# Patient Record
Sex: Female | Born: 1980 | Race: Black or African American | Hispanic: No | Marital: Single | Smoking: Never smoker
Health system: Southern US, Community
[De-identification: ages and names within clinical notes are randomized; demographics above are authoritative.]

## PROBLEM LIST (undated history)

## (undated) DIAGNOSIS — K219 Gastro-esophageal reflux disease without esophagitis: Secondary | ICD-10-CM

---

## 1999-12-11 ENCOUNTER — Emergency Department (HOSPITAL_COMMUNITY): Admission: EM | Admit: 1999-12-11 | Discharge: 1999-12-11 | Payer: Self-pay | Admitting: Emergency Medicine

## 1999-12-11 ENCOUNTER — Encounter: Payer: Self-pay | Admitting: Emergency Medicine

## 2008-10-22 ENCOUNTER — Emergency Department (HOSPITAL_COMMUNITY): Admission: EM | Admit: 2008-10-22 | Discharge: 2008-10-22 | Payer: Self-pay | Admitting: Emergency Medicine

## 2009-04-24 ENCOUNTER — Inpatient Hospital Stay (HOSPITAL_COMMUNITY): Admission: AD | Admit: 2009-04-24 | Discharge: 2009-04-24 | Payer: Self-pay | Admitting: Obstetrics and Gynecology

## 2009-06-05 ENCOUNTER — Inpatient Hospital Stay (HOSPITAL_COMMUNITY): Admission: RE | Admit: 2009-06-05 | Discharge: 2009-06-07 | Payer: Self-pay | Admitting: Obstetrics and Gynecology

## 2010-05-31 ENCOUNTER — Emergency Department (HOSPITAL_COMMUNITY): Payer: Medicaid Other

## 2010-05-31 ENCOUNTER — Emergency Department (HOSPITAL_COMMUNITY)
Admission: EM | Admit: 2010-05-31 | Discharge: 2010-05-31 | Disposition: A | Discharge status: Home / Self Care | Payer: Medicaid Other | Attending: Emergency Medicine | Admitting: Emergency Medicine

## 2010-05-31 DIAGNOSIS — R1013 Epigastric pain: Secondary | ICD-10-CM | POA: Insufficient documentation

## 2010-05-31 DIAGNOSIS — K219 Gastro-esophageal reflux disease without esophagitis: Secondary | ICD-10-CM | POA: Insufficient documentation

## 2010-05-31 DIAGNOSIS — J45909 Unspecified asthma, uncomplicated: Secondary | ICD-10-CM | POA: Insufficient documentation

## 2010-05-31 DIAGNOSIS — R1011 Right upper quadrant pain: Secondary | ICD-10-CM | POA: Insufficient documentation

## 2010-05-31 LAB — DIFFERENTIAL
Basophils Absolute: 0.1 10*3/uL (ref 0.0–0.1)
Basophils Relative: 1 % (ref 0–1)
Eosinophils Absolute: 0.2 10*3/uL (ref 0.0–0.7)
Eosinophils Relative: 2 % (ref 0–5)
Lymphocytes Relative: 29 % (ref 12–46)
Lymphs Abs: 3.1 10*3/uL (ref 0.7–4.0)
Monocytes Absolute: 0.6 10*3/uL (ref 0.1–1.0)
Monocytes Relative: 6 % (ref 3–12)
Neutro Abs: 6.9 10*3/uL (ref 1.7–7.7)
Neutrophils Relative %: 63 % (ref 43–77)

## 2010-05-31 LAB — URINALYSIS, ROUTINE W REFLEX MICROSCOPIC
Bilirubin Urine: NEGATIVE
Glucose, UA: NEGATIVE mg/dL
Hgb urine dipstick: NEGATIVE
Ketones, ur: NEGATIVE mg/dL
Nitrite: NEGATIVE
Protein, ur: NEGATIVE mg/dL
Specific Gravity, Urine: 1.006 (ref 1.005–1.030)
Urobilinogen, UA: 0.2 mg/dL (ref 0.0–1.0)
pH: 6 (ref 5.0–8.0)

## 2010-05-31 LAB — COMPREHENSIVE METABOLIC PANEL
AST: 19 U/L (ref 0–37)
Albumin: 3.6 g/dL (ref 3.5–5.2)
Calcium: 8.9 mg/dL (ref 8.4–10.5)
Chloride: 108 mEq/L (ref 96–112)
Creatinine, Ser: 0.94 mg/dL (ref 0.4–1.2)
GFR calc Af Amer: >60 mL/min (ref >60)
Total Protein: 6.6 g/dL (ref 6.0–8.3)

## 2010-05-31 LAB — CBC
MCH: 25.7 pg — ABNORMAL LOW (ref 26.0–34.0)
MCHC: 32.5 g/dL (ref 30.0–36.0)
Platelets: 298 10*3/uL (ref 150–400)
RBC: 4.17 MIL/uL (ref 3.87–5.11)

## 2010-05-31 LAB — POCT PREGNANCY, URINE: Preg Test, Ur: NEGATIVE

## 2010-06-16 LAB — CBC
HCT: 30.9 % — ABNORMAL LOW (ref 36.0–46.0)
Hemoglobin: 8.6 g/dL — ABNORMAL LOW (ref 12.0–15.0)
Hemoglobin: 9.9 g/dL — ABNORMAL LOW (ref 12.0–15.0)
MCHC: 32 g/dL (ref 30.0–36.0)
MCHC: 32.1 g/dL (ref 30.0–36.0)
MCV: 79.8 fL (ref 78.0–100.0)
Platelets: 196 10*3/uL (ref 150–400)
RBC: 3.31 MIL/uL — ABNORMAL LOW (ref 3.87–5.11)
RBC: 3.87 MIL/uL (ref 3.87–5.11)
WBC: 16.9 10*3/uL — ABNORMAL HIGH (ref 4.0–10.5)

## 2010-06-16 LAB — RPR: RPR Ser Ql: NONREACTIVE

## 2010-06-28 LAB — URINALYSIS, ROUTINE W REFLEX MICROSCOPIC
Glucose, UA: NEGATIVE mg/dL
Protein, ur: NEGATIVE mg/dL
Specific Gravity, Urine: 1.005 (ref 1.005–1.030)
pH: 6.5 (ref 5.0–8.0)

## 2010-06-28 LAB — HCG, QUANTITATIVE, PREGNANCY: hCG, Beta Chain, Quant, S: 36539 m[IU]/mL — ABNORMAL HIGH (ref <5)

## 2010-06-28 LAB — POCT PREGNANCY, URINE: Preg Test, Ur: POSITIVE

## 2012-07-22 ENCOUNTER — Other Ambulatory Visit: Payer: Self-pay | Admitting: Medical

## 2012-07-22 ENCOUNTER — Encounter: Payer: Self-pay | Admitting: Medical

## 2012-07-22 ENCOUNTER — Other Ambulatory Visit (INDEPENDENT_AMBULATORY_CARE_PROVIDER_SITE_OTHER): Payer: Medicaid Other | Admitting: General Practice

## 2012-07-22 DIAGNOSIS — Z3200 Encounter for pregnancy test, result unknown: Secondary | ICD-10-CM

## 2012-07-22 DIAGNOSIS — Z3201 Encounter for pregnancy test, result positive: Secondary | ICD-10-CM

## 2012-07-22 LAB — POCT PREGNANCY, URINE: Preg Test, Ur: POSITIVE — AB

## 2012-07-22 NOTE — Progress Notes (Signed)
Patient came in for a pregnancy test only. Informed patient of positive result. Patient stated she would start care with [facility] but was waiting for [facility] approval.

## 2012-08-03 ENCOUNTER — Encounter: Payer: Self-pay | Admitting: Advanced Practice Midwife

## 2012-08-03 ENCOUNTER — Ambulatory Visit (INDEPENDENT_AMBULATORY_CARE_PROVIDER_SITE_OTHER): Payer: Medicaid Other | Admitting: Advanced Practice Midwife

## 2012-08-03 VITALS — BP 108/64 | Temp 99.0°F | Ht 62.25 in | Wt 163.4 lb

## 2012-08-03 DIAGNOSIS — Z3482 Encounter for supervision of other normal pregnancy, second trimester: Secondary | ICD-10-CM

## 2012-08-03 DIAGNOSIS — Z113 Encounter for screening for infections with a predominantly sexual mode of transmission: Secondary | ICD-10-CM

## 2012-08-03 DIAGNOSIS — O34219 Maternal care for unspecified type scar from previous cesarean delivery: Secondary | ICD-10-CM | POA: Insufficient documentation

## 2012-08-03 DIAGNOSIS — Z124 Encounter for screening for malignant neoplasm of cervix: Secondary | ICD-10-CM

## 2012-08-03 DIAGNOSIS — Z3480 Encounter for supervision of other normal pregnancy, unspecified trimester: Secondary | ICD-10-CM

## 2012-08-03 MED ORDER — INFLUENZA VIRUS VACC SPLIT PF IM SUSP: 0.5000 mL | Freq: Once | INTRAMUSCULAR | Status: DC

## 2012-08-03 NOTE — Progress Notes (Signed)
P= 95, Here for initial prenatal visit. Had a positive pregnancy test  on March 10th. States first day of LMP was around 04/27/12 , not sure exact date. States she has history of irregular periods. C/o trace edema in feet only, Given new pregancy information and discussed bmi and appropriate weight gain.

## 2012-08-03 NOTE — Progress Notes (Signed)
   Subjective:    Kathleen Harrell is a [name] at 14 weeks by unsure LMP being seen today for her first obstetrical visit.  Her obstetrical history is significant for twin pregnancy, term SVD x 2. Patient does intend to breast feed. Pregnancy history fully reviewed.  Patient reports no complaints.  Filed Vitals:   08/03/12 1004 08/03/12 1008  BP: 108/64   Temp: 99 F (37.2 C)   Height:  5' 2.25" (1.581 m)  Weight: 163 lb 6.4 oz (74.118 kg)     HISTORY: OB History   Grav Para Term Preterm Abortions TAB SAB Ect Mult Living   4 2 2  1  1  1 3      # Outc Date GA Lbr Len/2nd Wgt Sex Del Anes PTL Lv   1A TRM 3/02 [redacted]w[redacted]d  5lb5oz(2.41kg) M CS EPI     Comments: fraternal twins, one twin has PROM, has c/s due to breech, born at [place], [place]   1B  3/02 [redacted]w[redacted]d  4lb9oz(2.07kg) M       2 SAB 2006           3 TRM 3/11 [redacted]w[redacted]d  5lb9oz(2.523kg) M CS EPI     Comments: no complications, born at [facility]   4 CUR              History reviewed. No pertinent past medical history. Past Surgical History  Procedure Laterality Date  . Cesarean section     Family History  Problem Relation Age of Onset  . Diabetes Mother      Exam    Uterus:   20 week size, + FHR  Pelvic Exam:    Perineum: Normal Perineum   Vulva: normal   Vagina:  normal mucosa, normal discharge       Cervix: nulliparous appearance   Adnexa: normal adnexa   Bony Pelvis: average  System:     Skin: normal coloration and turgor, no rashes    Neurologic: oriented, normal   Extremities: normal strength, tone, and muscle mass       Mouth/Teeth mucous membranes moist, pharynx normal without lesions           Respiratory:  appears well, vitals normal, no respiratory distress, acyanotic, normal RR   Abdomen: soft, non-tender; bowel sounds normal; no masses,  no organomegaly   Urinary: urethral meatus normal      Assessment:    Pregnancy: [id number] Patient Active Problem List   Diagnosis Date Noted  . Supervision of  normal intrauterine pregnancy in multigravida 08/03/2012  . Previous cesarean delivery affecting pregnancy, antepartum 08/03/2012        Plan:     Initial labs drawn. Prenatal vitamins. Problem list reviewed and updated. Genetic Screening discussed Quad Screen: discussed, will order at appropriate [facility] base on u/s.  Ultrasound discussed; fetal survey: ordered.  Follow up in 4 weeks.    FRAZIER,NATALIE 08/03/2012

## 2012-08-03 NOTE — Patient Instructions (Addendum)
Pregnancy - Second Trimester The second trimester of pregnancy (3 to 6 months) is a period of rapid growth for you and your baby. At the end of the sixth month, your baby is about 9 inches long and weighs 1 1/2 pounds. You will begin to feel the baby move between 18 and 20 weeks of the pregnancy. This is called quickening. Weight gain is faster. A clear fluid (colostrum) may leak out of your breasts. You may feel small contractions of the womb (uterus). This is known as false labor or Braxton-Hicks contractions. This is like a practice for labor when the baby is ready to be born. Usually, the problems with morning sickness have usually passed by the end of your first trimester. Some women develop small dark blotches (called cholasma, mask of pregnancy) on their face that usually goes away after the baby is born. Exposure to the sun makes the blotches worse. Acne may also develop in some pregnant women and pregnant women who have acne, may find that it goes away. PRENATAL EXAMS  Blood work may continue to be done during prenatal exams. These tests are done to check on your health and the probable health of your baby. Blood work is used to follow your blood levels (hemoglobin). Anemia (low hemoglobin) is common during pregnancy. Iron and vitamins are given to help prevent this. You will also be checked for diabetes between 24 and 28 weeks of the pregnancy. Some of the previous blood tests may be repeated.  The size of the uterus is measured during each visit. This is to make sure that the baby is continuing to grow properly according to the dates of the pregnancy.  Your blood pressure is checked every prenatal visit. This is to make sure you are not getting toxemia.  Your urine is checked to make sure you do not have an infection, diabetes or protein in the urine.  Your weight is checked often to make sure gains are happening at the suggested rate. This is to ensure that both you and your baby are  growing normally.  Sometimes, an ultrasound is performed to confirm the proper growth and development of the baby. This is a test which bounces harmless sound waves off the baby so your caregiver can more accurately determine due dates. Sometimes, a specialized test is done on the amniotic fluid surrounding the baby. This test is called an amniocentesis. The amniotic fluid is obtained by sticking a needle into the belly (abdomen). This is done to check the chromosomes in instances where there is a concern about possible genetic problems with the baby. It is also sometimes done near the end of pregnancy if an early delivery is required. In this case, it is done to help make sure the baby's lungs are mature enough for the baby to live outside of the womb. CHANGES OCCURING IN THE SECOND TRIMESTER OF PREGNANCY Your body goes through many changes during pregnancy. They vary from person to person. Talk to your caregiver about changes you notice that you are concerned about.  During the second trimester, you will likely have an increase in your appetite. It is normal to have cravings for certain foods. This varies from person to person and pregnancy to pregnancy.  Your lower abdomen will begin to bulge.  You may have to urinate more often because the uterus and baby are pressing on your bladder. It is also common to get more bladder infections during pregnancy (pain with urination). You can help this by   drinking lots of fluids and emptying your bladder before and after intercourse.  You may begin to get stretch marks on your hips, abdomen, and breasts. These are normal changes in the body during pregnancy. There are no exercises or medications to take that prevent this change.  You may begin to develop swollen and bulging veins (varicose veins) in your legs. Wearing support hose, elevating your feet for 15 minutes, 3 to 4 times a day and limiting salt in your diet helps lessen the problem.  Heartburn may  develop as the uterus grows and pushes up against the stomach. Antacids recommended by your caregiver helps with this problem. Also, eating smaller meals 4 to 5 times a day helps.  Constipation can be treated with a stool softener or adding bulk to your diet. Drinking lots of fluids, vegetables, fruits, and whole grains are helpful.  Exercising is also helpful. If you have been very active up until your pregnancy, most of these activities can be continued during your pregnancy. If you have been less active, it is helpful to start an exercise program such as walking.  Hemorrhoids (varicose veins in the rectum) may develop at the end of the second trimester. Warm sitz baths and hemorrhoid cream recommended by your caregiver helps hemorrhoid problems.  Backaches may develop during this time of your pregnancy. Avoid heavy lifting, wear low heal shoes and practice good posture to help with backache problems.  Some pregnant women develop tingling and numbness of their [place] and fingers because of swelling and tightening of ligaments in the wrist (carpel tunnel syndrome). This goes away after the baby is born.  As your breasts enlarge, you may have to get a bigger bra. Get a comfortable, cotton, support bra. Do not get a nursing bra until the last month of the pregnancy if you will be nursing the baby.  You may get a dark line from your belly button to the pubic area called the linea nigra.  You may develop rosy cheeks because of increase blood flow to the face.  You may develop spider looking lines of the face, neck, arms and chest. These go away after the baby is born. HOME CARE INSTRUCTIONS   It is extremely important to avoid all smoking, herbs, alcohol, and unprescribed drugs during your pregnancy. These chemicals affect the formation and growth of the baby. Avoid these chemicals throughout the pregnancy to ensure the delivery of a healthy infant.  Most of your home care instructions are the same  as suggested for the first trimester of your pregnancy. Keep your caregiver's appointments. Follow your caregiver's instructions regarding medication use, exercise and diet.  During pregnancy, you are providing food for you and your baby. Continue to eat regular, well-balanced meals. Choose foods such as meat, fish, milk and other low fat dairy products, vegetables, fruits, and whole-grain breads and cereals. Your caregiver will tell you of the ideal weight gain.  A physical sexual relationship may be continued up until near the end of pregnancy if there are no other problems. Problems could include early (premature) leaking of amniotic fluid from the membranes, vaginal bleeding, abdominal pain, or other medical or pregnancy problems.  Exercise regularly if there are no restrictions. Check with your caregiver if you are unsure of the safety of some of your exercises. The greatest weight gain will occur in the last 2 trimesters of pregnancy. Exercise will help you:  Control your weight.  Get you in shape for labor and delivery.  Lose weight   after you have the baby.  Wear a good support or jogging bra for breast tenderness during pregnancy. This may help if worn during sleep. Pads or tissues may be used in the bra if you are leaking colostrum.  Do not use hot tubs, steam rooms or saunas throughout the pregnancy.  Wear your seat belt at all times when driving. This protects you and your baby if you are in an accident.  Avoid raw meat, uncooked cheese, cat litter boxes and soil used by cats. These carry germs that can cause birth defects in the baby.  The second trimester is also a good time to visit your dentist for your dental health if this has not been done yet. Getting your teeth cleaned is OK. Use a soft toothbrush. Brush gently during pregnancy.  It is easier to loose urine during pregnancy. Tightening up and strengthening the pelvic muscles will help with this problem. Practice stopping  your urination while you are going to the bathroom. These are the same muscles you need to strengthen. It is also the muscles you would use as if you were trying to stop from passing gas. You can practice tightening these muscles up 10 times a set and repeating this about 3 times per day. Once you know what muscles to tighten up, do not perform these exercises during urination. It is more likely to contribute to an infection by backing up the urine.  Ask for help if you have financial, counseling or nutritional needs during pregnancy. Your caregiver will be able to offer counseling for these needs as well as refer you for other special needs.  Your skin may become oily. If so, wash your face with mild soap, use non-greasy moisturizer and oil or cream based makeup. MEDICATIONS AND DRUG USE IN PREGNANCY  Take prenatal vitamins as directed. The vitamin should contain 1 milligram of folic acid. Keep all vitamins out of reach of children. Only a couple vitamins or tablets containing iron may be fatal to a baby or young child when ingested.  Avoid use of all medications, including herbs, over-the-counter medications, not prescribed or suggested by your caregiver. Only take over-the-counter or prescription medicines for pain, discomfort, or fever as directed by your caregiver. Do not use aspirin.  Let your caregiver also know about herbs you may be using.  Alcohol is related to a number of birth defects. This includes fetal alcohol syndrome. All alcohol, in any form, should be avoided completely. Smoking will cause low birth rate and premature babies.  Street or illegal drugs are very harmful to the baby. They are absolutely forbidden. A baby born to an addicted mother will be addicted at birth. The baby will go through the same withdrawal an adult does. SEEK MEDICAL CARE IF:  You have any concerns or worries during your pregnancy. It is better to call with your questions if you feel they cannot wait,  rather than worry about them. SEEK IMMEDIATE MEDICAL CARE IF:   An unexplained oral temperature above 102 F (38.9 C) develops, or as your caregiver suggests.  You have leaking of fluid from the vagina (birth canal). If leaking membranes are suspected, take your temperature and tell your caregiver of this when you call.  There is vaginal spotting, bleeding, or passing clots. Tell your caregiver of the amount and how many pads are used. Light spotting in pregnancy is common, especially following intercourse.  You develop a bad smelling vaginal discharge with a change in the color from clear   to white.  You continue to feel sick to your stomach (nauseated) and have no relief from remedies suggested. You vomit blood or coffee ground-like materials.  You lose more than 2 pounds of weight or gain more than 2 pounds of weight over 1 week, or as suggested by your caregiver.  You notice swelling of your face, hands, feet, or legs.  You get exposed to German measles and have never had them.  You are exposed to fifth disease or chickenpox.  You develop belly (abdominal) pain. Round ligament discomfort is a common non-cancerous (benign) cause of abdominal pain in pregnancy. Your caregiver still must evaluate you.  You develop a bad headache that does not go away.  You develop fever, diarrhea, pain with urination, or shortness of breath.  You develop visual problems, blurry, or double vision.  You fall or are in a car accident or any kind of trauma.  There is mental or physical violence at home. Document Released: 03/03/2001 Document Revised: 06/01/2011 Document Reviewed: 09/05/2008 Exit[employer] Patient Information 2013 [employer].  Breastfeeding Deciding to breastfeed is one of the best choices you can make for you and your baby. The information that follows gives a brief overview of the benefits of breastfeeding as well as common topics surrounding breastfeeding. BENEFITS OF  BREASTFEEDING For the baby  The first milk (colostrum) helps the baby's digestive system function better.   There are antibodies in the mother's milk that help the baby fight off infections.   The baby has a lower incidence of asthma, allergies, and sudden infant death syndrome (SIDS).   The nutrients in breast milk are better for the baby than infant formulas, and breast milk helps the baby's brain grow better.   Babies who breastfeed have less gas, colic, and constipation.  For the mother  Breastfeeding helps develop a very special bond between the mother and her baby.   Breastfeeding is convenient, always available at the correct temperature, and costs nothing.   Breastfeeding burns calories in the mother and helps her lose weight that was gained during pregnancy.   Breastfeeding makes the uterus contract back down to normal size faster and slows bleeding following delivery.   Breastfeeding mothers have a lower risk of developing breast cancer.  BREASTFEEDING FREQUENCY  A healthy, full-term baby may breastfeed as often as every hour or space his or her feedings to every 3 hours.   Watch your baby for signs of hunger. Nurse your baby if he or she shows signs of hunger. How often you nurse will vary from baby to baby.   Nurse as often as the baby requests, or when you feel the need to reduce the fullness of your breasts.   Awaken the baby if it has been 3 4 hours since the last feeding.   Frequent feeding will help the mother make more milk and will help prevent problems, such as sore nipples and engorgement of the breasts.  BABY'S POSITION AT THE BREAST  Whether lying down or sitting, be sure that the baby's tummy is facing your tummy.   Support the breast with 4 fingers underneath the breast and the thumb above. Make sure your fingers are well away from the nipple and baby's mouth.   Stroke the baby's lips gently with your finger or nipple.   When the  baby's mouth is open wide enough, place all of your nipple and as much of the areola as possible into your baby's mouth.   Pull the baby in   close so the tip of the nose and the baby's cheeks touch the breast during the feeding.  FEEDINGS AND SUCTION  The length of each feeding varies from baby to baby and from feeding to feeding.   The baby must suck about 2 3 minutes for your milk to get to him or her. This is called a "let down." For this reason, allow the baby to feed on each breast as long as he or she wants. Your baby will end the feeding when he or she has received the right balance of nutrients.   To break the suction, put your finger into the corner of the baby's mouth and slide it between his or her gums before removing your breast from his or her mouth. This will help prevent sore nipples.  HOW TO TELL WHETHER YOUR BABY IS GETTING ENOUGH BREAST MILK. Wondering whether or not your baby is getting enough milk is a common concern among mothers. You can be assured that your baby is getting enough milk if:   Your baby is actively sucking and you hear swallowing.   Your baby seems relaxed and satisfied after a feeding.   Your baby nurses at least 8 12 times in a 24 hour time period. Nurse your baby until he or she unlatches or falls asleep at the first breast (at least 10 20 minutes), then offer the second side.   Your baby is wetting 5 6 disposable diapers (6 8 cloth diapers) in a 24 hour period by 5 6 days of age.   Your baby is having at least 3 4 stools every 24 hours for the first 6 weeks. The stool should be soft and yellow.   Your baby should gain 4 7 ounces per week after he or she is 4 days old.   Your breasts feel softer after nursing.  REDUCING BREAST ENGORGEMENT  In the first week after your baby is born, you may experience signs of breast engorgement. When breasts are engorged, they feel heavy, warm, full, and may be tender to the touch. You can reduce  engorgement if you:   Nurse frequently, every 2 3 hours. Mothers who breastfeed early and often have fewer problems with engorgement.   Place light ice packs on your breasts for 10 20 minutes between feedings. This reduces swelling. Wrap the ice packs in a lightweight towel to protect your skin. Bags of frozen vegetables work well for this purpose.   Take a warm shower or apply warm, moist heat to your breast for 5 10 minutes just before each feeding. This increases circulation and helps the milk flow.   Gently massage your breast before and during the feeding. Using your finger tips, massage from the chest wall towards your nipple in a circular motion.   Make sure that the baby empties at least one breast at every feeding before switching sides.   Use a breast pump to empty the breasts if your baby is sleepy or not nursing well. You may also want to pump if you are returning to work oryou feel you are getting engorged.   Avoid bottle feeds, pacifiers, or supplemental feedings of water or juice in place of breastfeeding. Breast milk is all the food your baby needs. It is not necessary for your baby to have water or formula. In fact, to help your breasts make more milk, it is best not to give your baby supplemental feedings during the early weeks.   Be sure the baby is latched   on and positioned properly while breastfeeding.   Wear a supportive bra, avoiding underwire styles.   Eat a balanced diet with enough fluids.   Rest often, relax, and take your prenatal vitamins to prevent fatigue, stress, and anemia.  If you follow these suggestions, your engorgement should improve in 24 48 hours. If you are still experiencing difficulty, call your lactation consultant or caregiver.  CARING FOR YOURSELF Take care of your breasts  Bathe or shower daily.   Avoid using soap on your nipples.   Start feedings on your left breast at one feeding and on your right breast at the next  feeding.   You will notice an increase in your milk supply 2 5 days after delivery. You may feel some discomfort from engorgement, which makes your breasts very firm and often tender. Engorgement "peaks" out within 24 48 hours. In the meantime, apply warm moist towels to your breasts for 5 10 minutes before feeding. Gentle massage and expression of some milk before feeding will soften your breasts, making it easier for your baby to latch on.   Wear a well-fitting nursing bra, and air dry your nipples for a 3 4minutes after each feeding.   Only use cotton bra pads.   Only use pure lanolin on your nipples after nursing. You do not need to wash it off before feeding the baby again. Another option is to express a few drops of breast milk and gently massage it into your nipples.  Take care of yourself  Eat well-balanced meals and nutritious snacks.   Drinking milk, fruit juice, and water to satisfy your thirst (about 8 glasses a day).   Get plenty of rest.  Avoid foods that you notice affect the baby in a bad way.  SEEK MEDICAL CARE IF:   You have difficulty with breastfeeding and need help.   You have a hard, red, sore area on your breast that is accompanied by a fever.   Your baby is too sleepy to eat well or is having trouble sleeping.   Your baby is wetting less than 6 diapers a day, by 5 days of age.   Your baby's skin or white part of his or her eyes is more yellow than it was in the hospital.   You feel depressed.  Document Released: 03/09/2005 Document Revised: 09/08/2011 Document Reviewed: 06/07/2011 [employer] Patient Information 2013 [employer].  

## 2012-08-04 LAB — HIV ANTIBODY (ROUTINE TESTING W REFLEX): HIV: NONREACTIVE

## 2012-08-05 LAB — OBSTETRIC PANEL
Antibody Screen: NEGATIVE
Basophils Absolute: 0 10*3/uL (ref 0.0–0.1)
Basophils Relative: 0 % (ref 0–1)
Eosinophils Relative: 2 % (ref 0–5)
HCT: 31.2 % — ABNORMAL LOW (ref 36.0–46.0)
MCHC: 33 g/dL (ref 30.0–36.0)
Monocytes Absolute: 0.6 10*3/uL (ref 0.1–1.0)
Neutro Abs: 8.7 10*3/uL — ABNORMAL HIGH (ref 1.7–7.7)
Platelets: 268 10*3/uL (ref 150–400)
RDW: 17.4 % — ABNORMAL HIGH (ref 11.5–15.5)

## 2012-08-05 LAB — HEMOGLOBINOPATHY EVALUATION
Hgb A2 Quant: 2.4 % (ref 2.2–3.2)
Hgb F Quant: 0 % (ref 0.0–2.0)

## 2012-08-10 ENCOUNTER — Ambulatory Visit (HOSPITAL_COMMUNITY)
Admission: RE | Admit: 2012-08-10 | Discharge: 2012-08-10 | Disposition: A | Discharge status: Home / Self Care | Payer: Medicaid Other | Source: Ambulatory Visit | Attending: Advanced Practice Midwife | Admitting: Advanced Practice Midwife

## 2012-08-10 ENCOUNTER — Encounter: Payer: Self-pay | Admitting: Advanced Practice Midwife

## 2012-08-10 DIAGNOSIS — Z363 Encounter for antenatal screening for malformations: Secondary | ICD-10-CM | POA: Insufficient documentation

## 2012-08-10 DIAGNOSIS — O358XX0 Maternal care for other (suspected) fetal abnormality and damage, not applicable or unspecified: Secondary | ICD-10-CM | POA: Insufficient documentation

## 2012-08-10 DIAGNOSIS — Z1389 Encounter for screening for other disorder: Secondary | ICD-10-CM | POA: Insufficient documentation

## 2012-08-10 DIAGNOSIS — Z3482 Encounter for supervision of other normal pregnancy, second trimester: Secondary | ICD-10-CM

## 2012-08-10 DIAGNOSIS — O34219 Maternal care for unspecified type scar from previous cesarean delivery: Secondary | ICD-10-CM | POA: Insufficient documentation

## 2012-08-11 ENCOUNTER — Encounter: Payer: Self-pay | Admitting: *Deleted

## 2012-08-31 ENCOUNTER — Ambulatory Visit (INDEPENDENT_AMBULATORY_CARE_PROVIDER_SITE_OTHER): Payer: Medicaid Other | Admitting: Family

## 2012-08-31 VITALS — BP 97/64 | Wt 164.2 lb

## 2012-08-31 DIAGNOSIS — Z3482 Encounter for supervision of other normal pregnancy, second trimester: Secondary | ICD-10-CM

## 2012-08-31 DIAGNOSIS — O34219 Maternal care for unspecified type scar from previous cesarean delivery: Secondary | ICD-10-CM

## 2012-08-31 LAB — POCT URINALYSIS DIP (DEVICE)
Hgb urine dipstick: NEGATIVE
Nitrite: NEGATIVE
Protein, ur: NEGATIVE mg/dL
Urobilinogen, UA: 0.2 mg/dL (ref 0.0–1.0)
pH: 6 (ref 5.0–8.0)

## 2012-08-31 NOTE — Progress Notes (Signed)
No questions or concerns; desires Quad, schedule anatomy ultrasound.  Urine results not back at time of discharge.

## 2012-08-31 NOTE — Progress Notes (Signed)
Pulse: 87 Needs rx for PNV. Patient complaining of headaches and dizziness.

## 2012-09-02 ENCOUNTER — Other Ambulatory Visit: Payer: Self-pay | Admitting: Family

## 2012-09-02 ENCOUNTER — Ambulatory Visit (HOSPITAL_COMMUNITY)
Admission: RE | Admit: 2012-09-02 | Discharge: 2012-09-02 | Disposition: A | Discharge status: Home / Self Care | Payer: Medicaid Other | Source: Ambulatory Visit | Attending: Family | Admitting: Family

## 2012-09-02 DIAGNOSIS — Z3482 Encounter for supervision of other normal pregnancy, second trimester: Secondary | ICD-10-CM

## 2012-09-02 DIAGNOSIS — O34219 Maternal care for unspecified type scar from previous cesarean delivery: Secondary | ICD-10-CM | POA: Insufficient documentation

## 2012-09-02 DIAGNOSIS — Z3689 Encounter for other specified antenatal screening: Secondary | ICD-10-CM | POA: Insufficient documentation

## 2012-09-06 ENCOUNTER — Encounter: Payer: Self-pay | Admitting: Family

## 2012-09-28 ENCOUNTER — Ambulatory Visit (INDEPENDENT_AMBULATORY_CARE_PROVIDER_SITE_OTHER): Payer: Medicaid Other | Admitting: Advanced Practice Midwife

## 2012-09-28 VITALS — BP 102/63 | Wt 166.7 lb

## 2012-09-28 DIAGNOSIS — O34219 Maternal care for unspecified type scar from previous cesarean delivery: Secondary | ICD-10-CM

## 2012-09-28 DIAGNOSIS — Z3492 Encounter for supervision of normal pregnancy, unspecified, second trimester: Secondary | ICD-10-CM

## 2012-09-28 LAB — POCT URINALYSIS DIP (DEVICE)
Leukocytes, UA: NEGATIVE
Nitrite: NEGATIVE
Protein, ur: NEGATIVE mg/dL
Urobilinogen, UA: 0.2 mg/dL (ref 0.0–1.0)
pH: 6.5 (ref 5.0–8.0)

## 2012-09-28 MED ORDER — PRENATAL VITAMINS 0.8 MG PO TABS: 1.0000 | ORAL_TABLET | Freq: Every day | ORAL | Status: AC

## 2012-09-28 NOTE — Addendum Note (Signed)
Addended by: Thressa Sheller D on: 09/28/2012 11:23 AM   Modules accepted: Orders

## 2012-09-28 NOTE — Progress Notes (Signed)
No complaints, feeling well.  

## 2012-09-28 NOTE — Progress Notes (Signed)
Pulse: 93

## 2012-10-03 ENCOUNTER — Ambulatory Visit (HOSPITAL_COMMUNITY)
Admission: RE | Admit: 2012-10-03 | Discharge: 2012-10-03 | Disposition: A | Discharge status: Home / Self Care | Payer: Medicaid Other | Source: Ambulatory Visit | Attending: Advanced Practice Midwife | Admitting: Advanced Practice Midwife

## 2012-10-03 DIAGNOSIS — Z3492 Encounter for supervision of normal pregnancy, unspecified, second trimester: Secondary | ICD-10-CM

## 2012-10-03 DIAGNOSIS — Z3689 Encounter for other specified antenatal screening: Secondary | ICD-10-CM | POA: Insufficient documentation

## 2012-10-03 DIAGNOSIS — O34219 Maternal care for unspecified type scar from previous cesarean delivery: Secondary | ICD-10-CM | POA: Insufficient documentation

## 2012-10-03 DIAGNOSIS — Z3482 Encounter for supervision of other normal pregnancy, second trimester: Secondary | ICD-10-CM

## 2012-10-11 ENCOUNTER — Telehealth: Payer: Self-pay | Admitting: *Deleted

## 2012-10-11 DIAGNOSIS — O34219 Maternal care for unspecified type scar from previous cesarean delivery: Secondary | ICD-10-CM

## 2012-10-11 NOTE — Telephone Encounter (Signed)
Pt left message stating that she had an episode of lightheadedness earlier in the Kathleen Harrell which lasted 45-60 min. She wanted to know if this is anything to be concerned about.

## 2012-10-11 NOTE — Telephone Encounter (Signed)
Called pt and pt informed me that she was lightheaded.  I asked pt if she had eaten that day.  Pt stated that at the time the episode happened she had not eaten "I thought that it could have been the reason but I just wanted to call anyway".  I strongly advised pt to eat six small meals day and to make sure she is drinking 64oz of water for hydration.  Pt stated understanding and did not have any other questions.

## 2012-10-26 ENCOUNTER — Ambulatory Visit (INDEPENDENT_AMBULATORY_CARE_PROVIDER_SITE_OTHER): Payer: Medicaid Other | Admitting: Advanced Practice Midwife

## 2012-10-26 VITALS — BP 96/62 | Temp 99.0°F | Wt 167.0 lb

## 2012-10-26 DIAGNOSIS — O34219 Maternal care for unspecified type scar from previous cesarean delivery: Secondary | ICD-10-CM

## 2012-10-26 LAB — POCT URINALYSIS DIP (DEVICE)
Ketones, ur: NEGATIVE mg/dL
Leukocytes, UA: NEGATIVE
Nitrite: NEGATIVE
Protein, ur: NEGATIVE mg/dL
Urobilinogen, UA: 0.2 mg/dL (ref 0.0–1.0)

## 2012-10-26 LAB — CBC
HCT: 29.8 % — ABNORMAL LOW (ref 36.0–46.0)
MCHC: 32.9 g/dL (ref 30.0–36.0)
RDW: 15.2 % (ref 11.5–15.5)

## 2012-10-26 NOTE — Progress Notes (Signed)
Pulse- 77 Patient reports some abdominal pressure

## 2012-10-26 NOTE — Progress Notes (Signed)
Doing well.  Good fetal movement, denies vaginal bleeding, LOF, regular contractions.  Discussed BTL with pt, unsure.  Pt may want Mirena IUD.

## 2012-10-27 LAB — HIV ANTIBODY (ROUTINE TESTING W REFLEX): HIV: NONREACTIVE

## 2012-10-27 LAB — RPR

## 2012-11-09 ENCOUNTER — Ambulatory Visit (INDEPENDENT_AMBULATORY_CARE_PROVIDER_SITE_OTHER): Payer: Medicaid Other | Admitting: Family

## 2012-11-09 VITALS — BP 105/58 | Wt 167.5 lb

## 2012-11-09 DIAGNOSIS — Z3482 Encounter for supervision of other normal pregnancy, second trimester: Secondary | ICD-10-CM

## 2012-11-09 DIAGNOSIS — Z3483 Encounter for supervision of other normal pregnancy, third trimester: Secondary | ICD-10-CM

## 2012-11-09 DIAGNOSIS — O34219 Maternal care for unspecified type scar from previous cesarean delivery: Secondary | ICD-10-CM

## 2012-11-09 LAB — POCT URINALYSIS DIP (DEVICE)
Glucose, UA: NEGATIVE mg/dL
Leukocytes, UA: NEGATIVE
Nitrite: NEGATIVE
Protein, ur: NEGATIVE mg/dL
Specific Gravity, Urine: <=1.005 (ref 1.005–1.030)
Urobilinogen, UA: 0.2 mg/dL (ref 0.0–1.0)
pH: 6 (ref 5.0–8.0)

## 2012-11-09 NOTE — Progress Notes (Signed)
Reports nasal congestion x 4 days; no fever, body aches or chills, advised decongestant and increasing fluids; also reports increased heartburn, discussed dietary changes, may also try Zantac or Pepcid.

## 2012-11-09 NOTE — Progress Notes (Signed)
Pulse: 88 Thinks she has a cold or sinus infection.  Also has very bad heartburn.

## 2012-11-14 ENCOUNTER — Telehealth: Payer: Self-pay

## 2012-11-14 MED ORDER — OMEPRAZOLE 20 MG PO CPDR: 20.0000 mg | DELAYED_RELEASE_CAPSULE | Freq: Every day | ORAL | Status: DC

## 2012-11-14 NOTE — Telephone Encounter (Signed)
Pt wants to be called in to her pharmacy something for heartburn/acid reflux.   Called pt and informed pt that an Rx of prilosec has been sent to [facility] pharmacy off [address] and she will need take it once a day.  Pt stated understanding and no further questions.

## 2012-11-23 ENCOUNTER — Ambulatory Visit (INDEPENDENT_AMBULATORY_CARE_PROVIDER_SITE_OTHER): Payer: Medicaid Other | Admitting: Advanced Practice Midwife

## 2012-11-23 VITALS — BP 111/56 | Temp 97.2°F | Wt 169.2 lb

## 2012-11-23 DIAGNOSIS — O34219 Maternal care for unspecified type scar from previous cesarean delivery: Secondary | ICD-10-CM

## 2012-11-23 DIAGNOSIS — O99019 Anemia complicating pregnancy, unspecified trimester: Secondary | ICD-10-CM

## 2012-11-23 LAB — POCT URINALYSIS DIP (DEVICE)
Bilirubin Urine: NEGATIVE
Glucose, UA: NEGATIVE mg/dL
Leukocytes, UA: NEGATIVE
Nitrite: NEGATIVE
Urobilinogen, UA: 1 mg/dL (ref 0.0–1.0)

## 2012-11-23 NOTE — Progress Notes (Signed)
Doing well.  Good fetal movement, denies vaginal bleeding, LOF, regular contractions.  

## 2012-11-23 NOTE — Progress Notes (Signed)
P= 86,  C/o contractions once in a while, not daily.  Desires to get tdap next visit. Because has a cough today.

## 2012-11-27 ENCOUNTER — Encounter (HOSPITAL_COMMUNITY): Payer: Self-pay | Admitting: Nurse Practitioner

## 2012-11-27 ENCOUNTER — Emergency Department (HOSPITAL_COMMUNITY)
Admission: EM | Admit: 2012-11-27 | Discharge: 2012-11-27 | Disposition: A | Discharge status: Home / Self Care | Payer: Medicaid Other | Attending: Emergency Medicine | Admitting: Emergency Medicine

## 2012-11-27 DIAGNOSIS — H5789 Other specified disorders of eye and adnexa: Secondary | ICD-10-CM | POA: Insufficient documentation

## 2012-11-27 DIAGNOSIS — H579 Unspecified disorder of eye and adnexa: Secondary | ICD-10-CM | POA: Insufficient documentation

## 2012-11-27 DIAGNOSIS — Z880 Allergy status to penicillin: Secondary | ICD-10-CM | POA: Insufficient documentation

## 2012-11-27 DIAGNOSIS — Z79899 Other long term (current) drug therapy: Secondary | ICD-10-CM | POA: Insufficient documentation

## 2012-11-27 MED ORDER — NAPHAZOLINE HCL 0.1 % OP SOLN: 1.0000 [drp] | Freq: Four times a day (QID) | OPHTHALMIC | Status: DC | PRN

## 2012-11-27 MED ORDER — FLUORESCEIN SODIUM 1 MG OP STRP
1.0000 | ORAL_STRIP | Freq: Once | OPHTHALMIC | Status: DC
  Filled 2012-11-27: qty 1

## 2012-11-27 MED ORDER — TETRACAINE HCL 0.5 % OP SOLN
1.0000 [drp] | Freq: Once | OPHTHALMIC | Status: DC
  Filled 2012-11-27: qty 2

## 2012-11-27 NOTE — ED Provider Notes (Signed)
CSN: [id number]     Arrival date & time 11/27/12  1048 History   First MD Initiated Contact with Patient 11/27/12 1108     Chief Complaint  Patient presents with  . Eye Pain   (Consider location/radiation/quality/duration/timing/severity/associated sxs/prior Treatment) HPI Comments: Patient is a 32 year old female presenting to the emergency department for 3 days of right eye itching with associated right eyelid swelling and describes her pain as mildly sore without radiation into eye. Patient states she does not for any trauma prior to onset of symptoms. Denies aggravating or alleviating factors. Patient denies any visual disturbance, photophobia, blurred vision, conjunctival injection, purulent drainage, fevers or chills. Patient is not a contact lens wearer.   History reviewed. No pertinent past medical history. Past Surgical History  Procedure Laterality Date  . Cesarean section     Family History  Problem Relation Age of Onset  . Diabetes Mother    History  Substance Use Topics  . Smoking status: Never Smoker   . Smokeless tobacco: Never Used  . Alcohol Use: No   OB History   Grav Para Term Preterm Abortions TAB SAB Ect Mult Living   4 2 2  1  1  1 3      Review of Systems  Constitutional: Negative for fever and chills.  HENT: Negative.   Eyes: Positive for itching.  Respiratory: Negative for shortness of breath.   Cardiovascular: Negative for chest pain.  Neurological: Negative for headaches.    Allergies  Penicillins  Home Medications   Current Outpatient Rx  Name  Route  Sig  Dispense  Refill  . omeprazole (PRILOSEC) 20 MG capsule   Oral   Take 20 mg by mouth daily as needed.         . Prenatal Multivit-Min-Fe-FA (PRENATAL VITAMINS) 0.8 MG tablet   Oral   Take 1 tablet by mouth daily.   30 tablet   12   . naphazoline (NAPHCON) 0.1 % ophthalmic solution   Right Eye   Place 1 drop into the right eye 4 (four) times daily as needed.   15 mL   0     BP 99/60  Pulse 84  Temp(Src) 99 F (37.2 C) (Oral)  Resp 16  Ht 5\' 4"  (1.626 m)  Wt 166 lb (75.297 kg)  BMI 28.48 kg/m2  SpO2 100%  LMP 04/27/2012 Physical Exam  Constitutional: She is oriented to person, place, and time. She appears well-developed and well-nourished. No distress.  HENT:  Head: Normocephalic and atraumatic.  Right Ear: External ear normal.  Left Ear: External ear normal.  Nose: Nose normal.  Mouth/Throat: Oropharynx is clear and moist. No oropharyngeal exudate.  Eyes: Conjunctivae and EOM are normal. Pupils are equal, round, and reactive to light. Lids are everted and swept, no foreign bodies found. Right eye exhibits no chemosis, no discharge, no exudate and no hordeolum. No foreign body present in the right eye. Left eye exhibits no chemosis, no discharge, no exudate and no hordeolum. No foreign body present in the left eye. Right conjunctiva is not injected. Right conjunctiva has no hemorrhage. Left conjunctiva is not injected. Left conjunctiva has no hemorrhage. No scleral icterus.  Fundoscopic exam:      The right eye shows no exudate, no hemorrhage and no papilledema.       The left eye shows no exudate, no hemorrhage and no papilledema.  Slit lamp exam:      The right eye shows no corneal abrasion, no corneal flare, no  corneal ulcer, no foreign body, no fluorescein uptake and no anterior chamber bulge.  OD: 20/20 OS: 20/20 Bilateral: 20/20  Minimal nonerythematous non-warm right upper eyelid swelling.  Neck: Normal range of motion. Neck supple.  Pulmonary/Chest: Effort normal.  Neurological: She is alert and oriented to person, place, and time.  Skin: Skin is warm and dry. She is not diaphoretic.  Psychiatric: She has a normal mood and affect.    ED Course  Procedures (including critical care time) Labs Review Labs Reviewed - No data to display Imaging Review No results found.  MDM   1. Eye irritation     Afebrile, NAD, non-toxic appearing,  AAOx4. Pt dx likely viral conjunctivitis based on presentation & eye exam, no antibiotics are indicated.  No evidence of HSV or VSV infection. Pt is not a contact lens wearer.  Exam non-concerning for orbital cellulitis, hyphema, corneal ulcers, corneal abrasions or trauma.  Patient will be discharged home with naphazoline drops every 1 to 2 hours for pruritus.  Patient has been instructed to use cool compresses and practice personal hygiene with frequent [place] washing.  Patient understands to follow up with ophthalmology, especially if new symptoms including change in vision, purulent drainage, or entrapment occur. Patient is agreeable to plan. Patient is stable at time of discharge         Jeannetta Ellis, PA-C 11/27/12 1610

## 2012-11-27 NOTE — ED Notes (Signed)
Pt reports itching to R eye onset 3 days ago, then began to have swelling and pain. Obvious swelling to L upper eyelid now. Denies any injuries. States vision is normal. Pt is 7 months pregnant.

## 2012-11-27 NOTE — Progress Notes (Signed)
Met Patient at bedside.Role of case manager explained-patient verbalizes her understanding.Patient reports increased eye discomfort as reason for ED visit.Patient has Medicaid.Patient provided  With Medicaid resource pack.Education provided for PCP services.Patient provided with 211-resource card.No further case manager needs identified.

## 2012-11-30 NOTE — ED Provider Notes (Signed)
Medical screening examination/treatment/procedure(s) were performed by non-physician practitioner and as supervising physician I was immediately available for consultation/collaboration.   Christopher J. Pollina, MD 11/30/12 1604 

## 2012-12-07 ENCOUNTER — Encounter: Payer: Self-pay | Admitting: *Deleted

## 2012-12-07 ENCOUNTER — Ambulatory Visit (INDEPENDENT_AMBULATORY_CARE_PROVIDER_SITE_OTHER): Payer: Medicaid Other | Admitting: Obstetrics and Gynecology

## 2012-12-07 ENCOUNTER — Encounter: Payer: Self-pay | Admitting: Obstetrics and Gynecology

## 2012-12-07 VITALS — BP 119/64 | Temp 98.7°F | Wt 168.2 lb

## 2012-12-07 DIAGNOSIS — O34219 Maternal care for unspecified type scar from previous cesarean delivery: Secondary | ICD-10-CM

## 2012-12-07 DIAGNOSIS — Z23 Encounter for immunization: Secondary | ICD-10-CM

## 2012-12-07 DIAGNOSIS — D509 Iron deficiency anemia, unspecified: Secondary | ICD-10-CM

## 2012-12-07 LAB — POCT URINALYSIS DIP (DEVICE)
Bilirubin Urine: NEGATIVE
Ketones, ur: NEGATIVE mg/dL
Leukocytes, UA: NEGATIVE
Nitrite: NEGATIVE

## 2012-12-07 MED ORDER — TETANUS-DIPHTH-ACELL PERTUSSIS 5-2.5-18.5 LF-MCG/0.5 IM SUSP
0.5000 mL | Freq: Once | INTRAMUSCULAR | Status: AC
  Administered 2012-12-07: 0.5 mL via INTRAMUSCULAR

## 2012-12-07 NOTE — Addendum Note (Signed)
Addended by: Faythe Casa on: 12/07/2012 01:22 PM   Modules accepted: Orders

## 2012-12-07 NOTE — Progress Notes (Signed)
Doing well. tDap today. For rpt C/S /BTL. Consent today

## 2012-12-07 NOTE — Patient Instructions (Signed)

## 2012-12-07 NOTE — Progress Notes (Signed)
P=96 C/o pressure sometimes. C/o contractions sometimes, not daily.

## 2012-12-21 ENCOUNTER — Ambulatory Visit (INDEPENDENT_AMBULATORY_CARE_PROVIDER_SITE_OTHER): Payer: Medicaid Other | Admitting: Advanced Practice Midwife

## 2012-12-21 ENCOUNTER — Encounter: Payer: Self-pay | Admitting: *Deleted

## 2012-12-21 VITALS — BP 108/70 | Temp 98.0°F | Wt 170.5 lb

## 2012-12-21 DIAGNOSIS — O34219 Maternal care for unspecified type scar from previous cesarean delivery: Secondary | ICD-10-CM

## 2012-12-21 LAB — POCT URINALYSIS DIP (DEVICE)
Glucose, UA: NEGATIVE mg/dL
Nitrite: NEGATIVE
Protein, ur: NEGATIVE mg/dL
Urobilinogen, UA: 1 mg/dL (ref 0.0–1.0)

## 2012-12-21 MED ORDER — INFLUENZA VAC SPLIT QUAD IM SUSP: 0.5000 mL | Freq: Once | INTRAMUSCULAR | Status: DC

## 2012-12-21 MED ORDER — INFLUENZA VIRUS VACC SPLIT PF IM SUSP
0.5000 mL | Freq: Once | INTRAMUSCULAR | Status: AC
  Administered 2012-12-21: 0.5 mL via INTRAMUSCULAR

## 2012-12-21 NOTE — Addendum Note (Signed)
Addended by: Kathee Delton on: 12/21/2012 03:10 PM   Modules accepted: Orders

## 2012-12-21 NOTE — Addendum Note (Signed)
Addended by: Kathee Delton on: 12/21/2012 03:32 PM   Modules accepted: Orders

## 2012-12-21 NOTE — Progress Notes (Signed)
Doing well.  Good fetal movement, denies vaginal bleeding, LOF, regular contractions. Some intermittent cramping, resolves with fluid and rest.

## 2012-12-21 NOTE — Progress Notes (Signed)
Pulse: 88

## 2013-01-04 ENCOUNTER — Encounter: Payer: Self-pay | Admitting: Family Medicine

## 2013-01-04 ENCOUNTER — Ambulatory Visit (INDEPENDENT_AMBULATORY_CARE_PROVIDER_SITE_OTHER): Payer: Medicaid Other | Admitting: Family Medicine

## 2013-01-04 VITALS — BP 117/70 | Wt 173.9 lb

## 2013-01-04 DIAGNOSIS — O34219 Maternal care for unspecified type scar from previous cesarean delivery: Secondary | ICD-10-CM

## 2013-01-04 DIAGNOSIS — Z3483 Encounter for supervision of other normal pregnancy, third trimester: Secondary | ICD-10-CM

## 2013-01-04 LAB — POCT URINALYSIS DIP (DEVICE)
Hgb urine dipstick: NEGATIVE
Ketones, ur: NEGATIVE mg/dL
Leukocytes, UA: NEGATIVE
Protein, ur: NEGATIVE mg/dL
pH: 6.5 (ref 5.0–8.0)

## 2013-01-04 LAB — OB RESULTS CONSOLE GC/CHLAMYDIA: Gonorrhea: NEGATIVE

## 2013-01-04 NOTE — Progress Notes (Signed)
  Subjective:    Kathleen Harrell is a 32 y.o. female being seen today for her obstetrical visit. She is at [redacted]w[redacted]d gestation. Patient reports no complaints. Fetal movement: normal.  Menstrual History: OB History   Grav Para Term Preterm Abortions TAB SAB Ect Mult Living   4 2 2  1  1  1 3        Patient's last menstrual period was 04/27/2012.    The following portions of the patient's history were reviewed and updated as appropriate: allergies, current medications, past family history, past medical history, past social history, past surgical history and problem list.  Review of Systems Pertinent items are noted in HPI.   Objective:    BP 117/70  Wt 78.881 kg (173 lb 14.4 oz)  BMI 29.84 kg/m2  LMP 04/27/2012 FHT:  130 BPM  Uterine Size: 34 cm and size equals dates  Presentation:      Assessment:   Kathleen Harrell is a 32 y.o. G4P2013 at [redacted]w[redacted]d  here for ROB visit.  Not taking iron, recommended  Needs repeat C-section, schedule 39 wks  Discussed with Patient:  -Plans to bottle feed.  All questions answered. -Continue prenatal vitamins. -Reviewed fetal kick counts Pt to perform daily at a time when the baby is active, lie laterally with both hands on belly in quiet room and count all movements (hiccups, shoulder rolls, obvious kicks, etc); pt is to report to clinic L&D for less than 10 movements felt in a one hour time period-pt told as soon as she counts 10 movements the count is complete.  - Routine precautions discussed (depression, infection s/s).   Patient provided with all pertinent phone numbers for emergencies. - RTC for any VB, regular, painful cramps/ctxs occurring at a rate of >2/10 min, fever (100.5 or higher), n/v/d, any pain that is unresolving or worsening, LOF, decreased fetal movement, CP, SOB, edema - RTC in 2 weeks for next appt. - Did GBS swabs today and will f/u results and call if abnormal.  Contact#:  Pt has no drug allergies, so will get PCN if GBS+ OR  will get sensitivities on GBS swab as pt is PCN-allergic.  Problems: Patient Active Problem List   Diagnosis Date Noted  . Anemia, iron deficiency 12/07/2012  . Supervision of normal intrauterine pregnancy in multigravida 08/03/2012  . Previous cesarean delivery affecting pregnancy, antepartum 08/03/2012    To Do: 1. GBS today, PCN allergic 2. GC/C today 2. Labor bag packed  [ ]  Vaccines: Flu: recd  Tdap: recd [ ]  BCM: ?BTL [ ]  Readiness: baby has a place to sleep, car seat, other baby necessities.  Edu: [x]  PTL precautions; [ ]  BF class; [ ]  childbirth class; [ ]   BF counseling;

## 2013-01-04 NOTE — Progress Notes (Signed)
Pulse: 92

## 2013-01-04 NOTE — Patient Instructions (Signed)

## 2013-01-05 LAB — GC/CHLAMYDIA PROBE AMP: CT Probe RNA: NEGATIVE

## 2013-01-07 LAB — CULTURE, BETA STREP (GROUP B ONLY)

## 2013-01-10 ENCOUNTER — Encounter: Payer: Self-pay | Admitting: Family Medicine

## 2013-01-11 ENCOUNTER — Encounter: Payer: Medicaid Other | Admitting: Advanced Practice Midwife

## 2013-01-18 ENCOUNTER — Ambulatory Visit (INDEPENDENT_AMBULATORY_CARE_PROVIDER_SITE_OTHER): Payer: Medicaid Other | Admitting: Obstetrics and Gynecology

## 2013-01-18 VITALS — BP 118/74 | Wt 174.3 lb

## 2013-01-18 DIAGNOSIS — O34219 Maternal care for unspecified type scar from previous cesarean delivery: Secondary | ICD-10-CM

## 2013-01-18 LAB — POCT URINALYSIS DIP (DEVICE)
Protein, ur: NEGATIVE mg/dL
Specific Gravity, Urine: 1.02 (ref 1.005–1.030)
Urobilinogen, UA: 0.2 mg/dL (ref 0.0–1.0)
pH: 7 (ref 5.0–8.0)

## 2013-01-18 NOTE — Patient Instructions (Signed)

## 2013-01-18 NOTE — Progress Notes (Signed)
Will schedule tertiary C/S for 39 wk. Plans breast, Depo.

## 2013-01-18 NOTE — Progress Notes (Signed)
Pulse- 88 Patient reports pain/pressure in vagina and pelvis as well as occasional contractions

## 2013-01-20 ENCOUNTER — Encounter (HOSPITAL_COMMUNITY): Payer: Self-pay | Admitting: Pharmacist

## 2013-01-24 ENCOUNTER — Encounter (HOSPITAL_COMMUNITY)
Admission: RE | Admit: 2013-01-24 | Discharge: 2013-01-24 | Disposition: A | Discharge status: Home / Self Care | Payer: Medicaid Other | Source: Ambulatory Visit | Attending: Obstetrics & Gynecology | Admitting: Obstetrics & Gynecology

## 2013-01-24 ENCOUNTER — Encounter (HOSPITAL_COMMUNITY): Payer: Self-pay

## 2013-01-24 HISTORY — DX: Gastro-esophageal reflux disease without esophagitis: K21.9

## 2013-01-24 LAB — TYPE AND SCREEN: Antibody Screen: NEGATIVE

## 2013-01-24 LAB — CBC
Hemoglobin: 10.1 g/dL — ABNORMAL LOW (ref 12.0–15.0)
MCV: 76.3 fL — ABNORMAL LOW (ref 78.0–100.0)
Platelets: 179 10*3/uL (ref 150–400)
RBC: 3.97 MIL/uL (ref 3.87–5.11)
RDW: 14.4 % (ref 11.5–15.5)
WBC: 12.2 10*3/uL — ABNORMAL HIGH (ref 4.0–10.5)

## 2013-01-24 MED ORDER — GENTAMICIN SULFATE 40 MG/ML IJ SOLN
INTRAVENOUS | Status: AC
  Administered 2013-01-25: 100 mL via INTRAVENOUS
  Filled 2013-01-24: qty 8

## 2013-01-24 NOTE — Patient Instructions (Signed)
01/27/1419 Kathleen Harrell  01/24/2013   Your procedure is scheduled on:  01/26/13  Enter through the Main Entrance of [facility] at 1130 AM.  Pick up the phone at the desk and dial [phone].   Call this number if you have problems the morning of surgery: [phone]   Remember:   Do not eat food:After Midnight.  Do not drink clear liquids: 4 Hours before arrival.  Take these medicines the morning of surgery with A SIP OF WATER: Prilosec   Do not wear jewelry, make-up or nail polish.  Do not wear lotions, powders, or perfumes. You may wear deodorant.  Do not shave 48 hours prior to surgery.  Do not bring valuables to the hospital.  [facility] is not   responsible for any belongings or valuables brought to the hospital.  Contacts, dentures or bridgework may not be worn into surgery.  Leave suitcase in the car. After surgery it may be brought to your room.  For patients admitted to the hospital, checkout time is 11:00 AM the day of              discharge.   Patients discharged the day of surgery will not be allowed to drive             home.  Name and phone number of your driver: NA  Special Instructions:   Shower using CHG 2 nights before surgery and the night before surgery.  If you shower the day of surgery use CHG.  Use special wash - you have one bottle of CHG for all showers.  You should use approximately 1/3 of the bottle for each shower.   Please read over the following fact sheets that you were given:   Surgical Site Infection Prevention

## 2013-01-25 ENCOUNTER — Encounter (HOSPITAL_COMMUNITY): Payer: Self-pay | Admitting: Anesthesiology

## 2013-01-25 ENCOUNTER — Inpatient Hospital Stay (HOSPITAL_COMMUNITY)
Admission: AD | Admit: 2013-01-25 | Discharge: 2013-01-27 | DRG: 766 | Disposition: A | Discharge status: Home / Self Care | Payer: Medicaid Other | Source: Ambulatory Visit | Attending: Obstetrics & Gynecology | Admitting: Obstetrics & Gynecology

## 2013-01-25 ENCOUNTER — Encounter (HOSPITAL_COMMUNITY)
Admission: AD | Disposition: A | Discharge status: Home / Self Care | Payer: Self-pay | Source: Ambulatory Visit | Attending: Obstetrics & Gynecology

## 2013-01-25 ENCOUNTER — Encounter (HOSPITAL_COMMUNITY): Payer: Medicaid Other | Admitting: Certified Registered"

## 2013-01-25 ENCOUNTER — Inpatient Hospital Stay (HOSPITAL_COMMUNITY): Payer: Medicaid Other | Admitting: Certified Registered"

## 2013-01-25 DIAGNOSIS — Z98891 History of uterine scar from previous surgery: Secondary | ICD-10-CM

## 2013-01-25 DIAGNOSIS — Z3483 Encounter for supervision of other normal pregnancy, third trimester: Secondary | ICD-10-CM

## 2013-01-25 DIAGNOSIS — O34219 Maternal care for unspecified type scar from previous cesarean delivery: Principal | ICD-10-CM | POA: Diagnosis present

## 2013-01-25 LAB — SYPHILIS: RPR W/REFLEX TO RPR TITER AND TREPONEMAL ANTIBODIES, TRADITIONAL SCREENING AND DIAGNOSIS ALGORITHM: RPR Ser Ql: NONREACTIVE

## 2013-01-25 SURGERY — Surgical Case
Anesthesia: Epidural | Site: Abdomen | Wound class: Clean Contaminated

## 2013-01-25 MED ORDER — LACTATED RINGERS IV SOLN
INTRAVENOUS | Status: DC
  Administered 2013-01-25 (×3): via INTRAVENOUS

## 2013-01-25 MED ORDER — BUPIVACAINE HCL (PF) 0.5 % IJ SOLN
INTRAMUSCULAR | Status: DC | PRN
  Administered 2013-01-25: 30 mL

## 2013-01-25 MED ORDER — MORPHINE SULFATE 0.5 MG/ML IJ SOLN
INTRAMUSCULAR | Status: AC
  Filled 2013-01-25: qty 10

## 2013-01-25 MED ORDER — LACTATED RINGERS IV SOLN: INTRAVENOUS | Status: DC

## 2013-01-25 MED ORDER — MORPHINE SULFATE (PF) 0.5 MG/ML IJ SOLN
INTRAMUSCULAR | Status: DC | PRN
  Administered 2013-01-25: .15 mg via INTRATHECAL

## 2013-01-25 MED ORDER — SIMETHICONE 80 MG PO CHEW
80.0000 mg | CHEWABLE_TABLET | ORAL | Status: DC
  Administered 2013-01-25 – 2013-01-27 (×2): 80 mg via ORAL
  Filled 2013-01-25 (×2): qty 1

## 2013-01-25 MED ORDER — ONDANSETRON HCL 4 MG/2ML IJ SOLN
INTRAMUSCULAR | Status: DC | PRN
  Administered 2013-01-25: 4 mg via INTRAVENOUS

## 2013-01-25 MED ORDER — PHENYLEPHRINE 8 MG IN D5W 100 ML (0.08MG/ML) PREMIX OPTIME
INJECTION | INTRAVENOUS | Status: DC | PRN
  Administered 2013-01-25: 60 ug/min via INTRAVENOUS

## 2013-01-25 MED ORDER — DIPHENHYDRAMINE HCL 50 MG/ML IJ SOLN
INTRAMUSCULAR | Status: AC
  Filled 2013-01-25: qty 1

## 2013-01-25 MED ORDER — FENTANYL CITRATE 0.05 MG/ML IJ SOLN
INTRAMUSCULAR | Status: AC
  Filled 2013-01-25: qty 2

## 2013-01-25 MED ORDER — METOCLOPRAMIDE HCL 5 MG/ML IJ SOLN: 10.0000 mg | Freq: Three times a day (TID) | INTRAMUSCULAR | Status: DC | PRN

## 2013-01-25 MED ORDER — DIPHENHYDRAMINE HCL 50 MG/ML IJ SOLN: 12.5000 mg | INTRAMUSCULAR | Status: DC | PRN

## 2013-01-25 MED ORDER — SODIUM CHLORIDE 0.9 % IJ SOLN: 3.0000 mL | INTRAMUSCULAR | Status: DC | PRN

## 2013-01-25 MED ORDER — LANOLIN HYDROUS EX OINT: 1.0000 "application " | TOPICAL_OINTMENT | CUTANEOUS | Status: DC | PRN

## 2013-01-25 MED ORDER — BUPIVACAINE IN DEXTROSE 0.75-8.25 % IT SOLN
INTRATHECAL | Status: DC | PRN
  Administered 2013-01-25: 1.5 mL via INTRATHECAL

## 2013-01-25 MED ORDER — WITCH HAZEL-GLYCERIN EX PADS: 1.0000 "application " | MEDICATED_PAD | CUTANEOUS | Status: DC | PRN

## 2013-01-25 MED ORDER — ONDANSETRON HCL 4 MG/2ML IJ SOLN: 4.0000 mg | Freq: Three times a day (TID) | INTRAMUSCULAR | Status: DC | PRN

## 2013-01-25 MED ORDER — DIPHENHYDRAMINE HCL 50 MG/ML IJ SOLN: 25.0000 mg | INTRAMUSCULAR | Status: DC | PRN

## 2013-01-25 MED ORDER — NALBUPHINE HCL 10 MG/ML IJ SOLN
5.0000 mg | INTRAMUSCULAR | Status: DC | PRN
  Filled 2013-01-25 (×3): qty 1

## 2013-01-25 MED ORDER — ONDANSETRON HCL 4 MG PO TABS: 4.0000 mg | ORAL_TABLET | ORAL | Status: DC | PRN

## 2013-01-25 MED ORDER — DEXTROSE 5 % IV SOLN
1.0000 ug/kg/h | INTRAVENOUS | Status: DC | PRN
  Filled 2013-01-25: qty 2

## 2013-01-25 MED ORDER — LACTATED RINGERS IV SOLN
INTRAVENOUS | Status: DC
  Administered 2013-01-25: 15:00:00 via INTRAVENOUS

## 2013-01-25 MED ORDER — OXYCODONE-ACETAMINOPHEN 5-325 MG PO TABS: 1.0000 | ORAL_TABLET | ORAL | Status: DC | PRN

## 2013-01-25 MED ORDER — DIBUCAINE 1 % RE OINT: 1.0000 "application " | TOPICAL_OINTMENT | RECTAL | Status: DC | PRN

## 2013-01-25 MED ORDER — SENNOSIDES-DOCUSATE SODIUM 8.6-50 MG PO TABS
2.0000 | ORAL_TABLET | ORAL | Status: DC
  Administered 2013-01-25 – 2013-01-27 (×2): 2 via ORAL
  Filled 2013-01-25 (×2): qty 2

## 2013-01-25 MED ORDER — MEPERIDINE HCL 25 MG/ML IJ SOLN: 6.2500 mg | INTRAMUSCULAR | Status: DC | PRN

## 2013-01-25 MED ORDER — OXYTOCIN 10 UNIT/ML IJ SOLN
40.0000 [IU] | INTRAVENOUS | Status: DC | PRN
  Administered 2013-01-25: 40 [IU] via INTRAVENOUS

## 2013-01-25 MED ORDER — SIMETHICONE 80 MG PO CHEW
80.0000 mg | CHEWABLE_TABLET | ORAL | Status: DC | PRN
  Administered 2013-01-26 – 2013-01-27 (×3): 80 mg via ORAL
  Filled 2013-01-25 (×3): qty 1

## 2013-01-25 MED ORDER — OXYTOCIN 10 UNIT/ML IJ SOLN
INTRAMUSCULAR | Status: AC
  Filled 2013-01-25: qty 4

## 2013-01-25 MED ORDER — MEASLES, MUMPS & RUBELLA VAC ~~LOC~~ INJ: 0.5000 mL | INJECTION | Freq: Once | SUBCUTANEOUS | Status: DC

## 2013-01-25 MED ORDER — SCOPOLAMINE 1 MG/3DAYS TD PT72
MEDICATED_PATCH | TRANSDERMAL | Status: AC
  Filled 2013-01-25: qty 1

## 2013-01-25 MED ORDER — PRENATAL MULTIVITAMIN CH
1.0000 | ORAL_TABLET | Freq: Every day | ORAL | Status: DC
  Administered 2013-01-26 – 2013-01-27 (×2): 1 via ORAL
  Filled 2013-01-25 (×2): qty 1

## 2013-01-25 MED ORDER — DIPHENHYDRAMINE HCL 50 MG/ML IJ SOLN
INTRAMUSCULAR | Status: DC | PRN
  Administered 2013-01-25: 25 mg via INTRAVENOUS

## 2013-01-25 MED ORDER — MENTHOL 3 MG MT LOZG: 1.0000 | LOZENGE | OROMUCOSAL | Status: DC | PRN

## 2013-01-25 MED ORDER — SCOPOLAMINE 1 MG/3DAYS TD PT72
1.0000 | MEDICATED_PATCH | Freq: Once | TRANSDERMAL | Status: DC
  Administered 2013-01-25: 1.5 mg via TRANSDERMAL

## 2013-01-25 MED ORDER — LACTATED RINGERS IV SOLN
Freq: Once | INTRAVENOUS | Status: AC
  Administered 2013-01-25: 08:00:00 via INTRAVENOUS

## 2013-01-25 MED ORDER — NALOXONE HCL 0.4 MG/ML IJ SOLN: 0.4000 mg | INTRAMUSCULAR | Status: DC | PRN

## 2013-01-25 MED ORDER — ZOLPIDEM TARTRATE 5 MG PO TABS: 5.0000 mg | ORAL_TABLET | Freq: Every evening | ORAL | Status: DC | PRN

## 2013-01-25 MED ORDER — DIPHENHYDRAMINE HCL 25 MG PO CAPS
25.0000 mg | ORAL_CAPSULE | ORAL | Status: DC | PRN
  Administered 2013-01-25: 25 mg via ORAL

## 2013-01-25 MED ORDER — ONDANSETRON HCL 4 MG/2ML IJ SOLN
INTRAMUSCULAR | Status: AC
  Filled 2013-01-25: qty 2

## 2013-01-25 MED ORDER — PHENYLEPHRINE HCL 10 MG/ML IJ SOLN
INTRAMUSCULAR | Status: AC
  Filled 2013-01-25: qty 1

## 2013-01-25 MED ORDER — FENTANYL CITRATE 0.05 MG/ML IJ SOLN: 25.0000 ug | INTRAMUSCULAR | Status: DC | PRN

## 2013-01-25 MED ORDER — DIPHENHYDRAMINE HCL 25 MG PO CAPS
25.0000 mg | ORAL_CAPSULE | Freq: Four times a day (QID) | ORAL | Status: DC | PRN
  Filled 2013-01-25: qty 1

## 2013-01-25 MED ORDER — IBUPROFEN 600 MG PO TABS
600.0000 mg | ORAL_TABLET | Freq: Four times a day (QID) | ORAL | Status: DC
  Administered 2013-01-25 – 2013-01-27 (×8): 600 mg via ORAL
  Filled 2013-01-25 (×9): qty 1

## 2013-01-25 MED ORDER — MAGNESIUM HYDROXIDE 400 MG/5ML PO SUSP: 30.0000 mL | ORAL | Status: DC | PRN

## 2013-01-25 MED ORDER — FENTANYL CITRATE 0.05 MG/ML IJ SOLN
INTRAMUSCULAR | Status: DC | PRN
  Administered 2013-01-25: 25 ug via INTRATHECAL

## 2013-01-25 MED ORDER — ONDANSETRON HCL 4 MG/2ML IJ SOLN: 4.0000 mg | INTRAMUSCULAR | Status: DC | PRN

## 2013-01-25 MED ORDER — NALBUPHINE SYRINGE 5 MG/0.5 ML
INJECTION | INTRAMUSCULAR | Status: AC
  Administered 2013-01-25: 10 mg via SUBCUTANEOUS
  Filled 2013-01-25: qty 1

## 2013-01-25 MED ORDER — BUPIVACAINE HCL (PF) 0.5 % IJ SOLN
INTRAMUSCULAR | Status: AC
  Filled 2013-01-25: qty 30

## 2013-01-25 MED ORDER — NALBUPHINE HCL 10 MG/ML IJ SOLN
5.0000 mg | INTRAMUSCULAR | Status: DC | PRN
Start: 2013-01-25 — End: 2013-01-27
  Administered 2013-01-25: 10 mg via SUBCUTANEOUS
  Filled 2013-01-25: qty 1

## 2013-01-25 MED ORDER — SCOPOLAMINE 1 MG/3DAYS TD PT72: 1.0000 | MEDICATED_PATCH | Freq: Once | TRANSDERMAL | Status: DC

## 2013-01-25 MED ORDER — OXYTOCIN 40 UNITS IN LACTATED RINGERS INFUSION - SIMPLE MED: 125.0000 mL/h | INTRAVENOUS | Status: AC

## 2013-01-25 SURGICAL SUPPLY — 36 items
BENZOIN TINCTURE PRP APPL 2/3 (GAUZE/BANDAGES/DRESSINGS) ×2 IMPLANT
CLAMP CORD UMBIL (MISCELLANEOUS) IMPLANT
CLOTH BEACON ORANGE TIMEOUT ST (SAFETY) ×2 IMPLANT
DRAPE LG THREE QUARTER DISP (DRAPES) ×4 IMPLANT
DRSG OPSITE POSTOP 4X10 (GAUZE/BANDAGES/DRESSINGS) ×2 IMPLANT
DURAPREP 26ML APPLICATOR (WOUND CARE) ×2 IMPLANT
ELECT REM PT RETURN 9FT ADLT (ELECTROSURGICAL) ×2
ELECTRODE REM PT RTRN 9FT ADLT (ELECTROSURGICAL) ×1 IMPLANT
EXTRACTOR VACUUM M CUP 4 TUBE (SUCTIONS) IMPLANT
GLOVE BIO SURGEON STRL SZ7 (GLOVE) ×2 IMPLANT
GLOVE BIOGEL PI IND STRL 7.0 (GLOVE) ×1 IMPLANT
GLOVE BIOGEL PI INDICATOR 7.0 (GLOVE) ×1
GOWN PREVENTION PLUS XLARGE (GOWN DISPOSABLE) ×2 IMPLANT
GOWN STRL REIN XL XLG (GOWN DISPOSABLE) ×2 IMPLANT
KIT ABG SYR 3ML LUER SLIP (SYRINGE) IMPLANT
NEEDLE HYPO 22GX1.5 SAFETY (NEEDLE) ×2 IMPLANT
NEEDLE HYPO 25X5/8 SAFETYGLIDE (NEEDLE) ×2 IMPLANT
NS IRRIG 1000ML POUR BTL (IV SOLUTION) ×2 IMPLANT
PACK C SECTION WH (CUSTOM PROCEDURE TRAY) ×2 IMPLANT
PAD ABD 7.5X8 STRL (GAUZE/BANDAGES/DRESSINGS) ×2 IMPLANT
PAD OB MATERNITY 4.3X12.25 (PERSONAL CARE ITEMS) ×2 IMPLANT
RTRCTR C-SECT PINK 25CM LRG (MISCELLANEOUS) IMPLANT
STAPLER VISISTAT 35W (STAPLE) IMPLANT
STRIP CLOSURE SKIN 1/2X4 (GAUZE/BANDAGES/DRESSINGS) ×2 IMPLANT
SUT PDS AB 0 CT1 27 (SUTURE) ×2 IMPLANT
SUT PDS AB 0 CTX 36 PDP370T (SUTURE) IMPLANT
SUT PLAIN 2 0 XLH (SUTURE) ×2 IMPLANT
SUT VIC AB 0 CT1 36 (SUTURE) ×4 IMPLANT
SUT VIC AB 0 CTX 36 (SUTURE) ×2
SUT VIC AB 0 CTX36XBRD ANBCTRL (SUTURE) ×2 IMPLANT
SUT VIC AB 4-0 KS 27 (SUTURE) ×2 IMPLANT
SYR 30ML LL (SYRINGE) ×2 IMPLANT
TAPE CLOTH SURG 4X10 WHT LF (GAUZE/BANDAGES/DRESSINGS) ×2 IMPLANT
TOWEL OR 17X24 6PK STRL BLUE (TOWEL DISPOSABLE) ×2 IMPLANT
TRAY FOLEY CATH 14FR (SET/KITS/TRAYS/PACK) ×2 IMPLANT
WATER STERILE IRR 1000ML POUR (IV SOLUTION) ×2 IMPLANT

## 2013-01-25 NOTE — Preoperative (Signed)
Beta Blockers   Reason not to administer Beta Blockers:Not Applicable 

## 2013-01-25 NOTE — Anesthesia Preprocedure Evaluation (Addendum)
Anesthesia Evaluation  Patient identified by MRN, date of birth, ID band Patient awake    Reviewed: Allergy & Precautions, H&P , NPO status , Patient's Chart, lab work & pertinent test results  Airway Mallampati: III TM Distance: >3 FB Neck ROM: Full    Dental no notable dental hx. (+) Teeth Intact   Pulmonary neg pulmonary ROS,  breath sounds clear to auscultation  Pulmonary exam normal       Cardiovascular negative cardio ROS  Rhythm:Regular Rate:Normal     Neuro/Psych negative neurological ROS  negative psych ROS   GI/Hepatic Neg liver ROS, GERD-  Medicated and Controlled,  Endo/Other  negative endocrine ROS  Renal/GU negative Renal ROS  negative genitourinary   Musculoskeletal negative musculoskeletal ROS (+)   Abdominal   Peds  Hematology negative hematology ROS (+)   Anesthesia Other Findings   Reproductive/Obstetrics (+) Pregnancy Previous C/Section                           Anesthesia Physical Anesthesia Plan  ASA: II  Anesthesia Plan: Epidural   Post-op Pain Management:    Induction:   Airway Management Planned: Natural Airway  Additional Equipment:   Intra-op Plan:   Post-operative Plan:   Informed Consent: I have reviewed the patients History and Physical, chart, labs and discussed the procedure including the risks, benefits and alternatives for the proposed anesthesia with the patient or authorized representative who has indicated his/her understanding and acceptance.     Plan Discussed with: Anesthesiologist  Anesthesia Plan Comments:         Anesthesia Quick Evaluation

## 2013-01-25 NOTE — Anesthesia Postprocedure Evaluation (Signed)
Anesthesia Post Note  Patient: Kathleen Harrell  Procedure(s) Performed: Procedure(s) (LRB): Repeat  CESAREAN SECTION (N/A)  Anesthesia type: Spinal  Patient location: Mother/Baby  Post pain: Pain level controlled  Post assessment: Post-op Vital signs reviewed  Last Vitals:  Filed Vitals:   01/25/13 1525  BP: 110/60  Pulse: 70  Temp: 36.4 C  Resp: 18    Post vital signs: Reviewed  Level of consciousness: awake  Complications: No apparent anesthesia complications

## 2013-01-25 NOTE — Anesthesia Postprocedure Evaluation (Signed)
  Anesthesia Post-op Note  Patient: Kathleen Harrell  Procedure(s) Performed: Procedure(s): Repeat  CESAREAN SECTION (N/A)  Patient Location: PACU  Anesthesia Type:Spinal  Level of Consciousness: awake, alert  and oriented  Airway and Oxygen Therapy: Patient Spontanous Breathing  Post-op Pain: none  Post-op Assessment: Post-op Vital signs reviewed, Patient's Cardiovascular Status Stable, Respiratory Function Stable, Patent Airway, No signs of Nausea or vomiting, Pain level controlled, No headache and No backache  Post-op Vital Signs: Reviewed  Complications: No apparent anesthesia complications

## 2013-01-25 NOTE — Op Note (Signed)
Kathleen Harrell PROCEDURE DATE: 01/25/2013  PREOPERATIVE DIAGNOSES: Intrauterine pregnancy at [redacted]w[redacted]d weeks gestation; two previous cesarean sections  POSTOPERATIVE DIAGNOSES: The same  PROCEDURE: Repeat Low Transverse Cesarean Section  SURGEON:  Dr. Jaynie Collins  ASSISTANT:  Dr. Suszanne Conners. Odom  ANESTHESIOLOGIST: Dr. Mal Amabile  INDICATIONS: Kathleen Harrell is a 32 y.o. [phone] at [redacted]w[redacted]d here for repeat cesarean section secondary to the indications listed under preoperative diagnoses; please see preoperative note for further details.  The risks of cesarean section were discussed with the patient including but were not limited to: bleeding which may require transfusion or reoperation; infection which may require antibiotics; injury to bowel, bladder, ureters or other surrounding organs; injury to the fetus; need for additional procedures including hysterectomy in the event of a life-threatening hemorrhage; placental abnormalities wth subsequent pregnancies, incisional problems, thromboembolic phenomenon and other postoperative/anesthesia complications.   The patient concurred with the proposed plan, giving informed written consent for the procedure.    FINDINGS:  Viable female infant in cephalic presentation.  Apgars 8 and 9.  Clear amniotic fluid.  Intact placenta, three vessel cord.  Loose nuchal cord x 1. Normal uterus, fallopian tubes and ovaries bilaterally. Moderate adhesive disease involving fascia, rectus muscle and peritoneum which made entry into the peritoneal cavity difficult.  Adhesions were lysed using sharp and blunt methods, in addition to electrocautery.  ANESTHESIA: Spinal INTRAVENOUS FLUIDS: 2600 ml ESTIMATED BLOOD LOSS: 800 ml URINE OUTPUT:  200 ml SPECIMENS: Placenta sent to L&D COMPLICATIONS: None immediate  PROCEDURE IN DETAIL:  The patient preoperatively received intravenous antibiotics and had sequential compression devices applied to her lower extremities.  She  was then taken to the operating room where spinal anesthesia was administered and was found to be adequate. She was then placed in a dorsal supine position with a leftward tilt, and prepped and draped in a sterile manner.  A foley catheter was placed into her bladder and attached to constant gravity.  After an adequate timeout was performed, a Pfannenstiel skin incision was made with scalpel over her preexisting scar and carried through to the underlying layer of fascia. The fascia was incised in the midline, and this incision was extended bilaterally using the Mayo scissors.  Kocher clamps were applied to the superior aspect of the fascial incision and an attempt was made to dissect off the underlying rectus muscles, but this was unsuccessful.   The rectus muscles were separated in the midline sharply and the peritoneum was entered sharply. The rectus muscles were transected horizontally using electrocautery to maximize exposure.  The adhesions of the vesicouterine peritoneum were lysed bluntly and sharply, to create a partial bladder flap. Attention was turned to the lower uterine segment where a low transverse hysterotomy was made with a scalpel and extended bilaterally sharply.  The infant was successfully delivered, the cord was clamped and cut and the infant was handed over to awaiting neonatology team. Uterine massage was then administered, and the placenta delivered intact with a three-vessel cord. The uterus was then cleared of clot and debris.  The hysterotomy was closed with 0 Vicryl in a running locked fashion, and an imbricating layer was also placed with 0 Vicryl. The pelvis was cleared of all clot and debris. Hemostasis was confirmed on all surfaces.  The fascia was then closed using 0 PDS in a running fashion.  The subcutaneous layer was irrigated, and 30 ml of 0.5% Marcaine was injected subcutaneously around the incision.  The skin was closed with a 4-0  Vicryl subcuticular stitch. The patient  tolerated the procedure well. Sponge, lap, instrument and needle counts were correct x 2.  She was taken to the recovery room in stable condition.   Jaynie Collins, MD, FACOG Attending Obstetrician & Gynecologist Faculty Practice, [facility]

## 2013-01-25 NOTE — Anesthesia Procedure Notes (Signed)
Spinal  Patient location during procedure: OR Start time: 01/25/2013 9:15 AM Staffing Anesthesiologist: FOSTER, MICHAEL A. Performed by: anesthesiologist  Preanesthetic Checklist Completed: patient identified, site marked, surgical consent, pre-op evaluation, timeout performed, IV checked, risks and benefits discussed and monitors and equipment checked Spinal Block Patient position: sitting Prep: site prepped and draped and DuraPrep Patient monitoring: heart rate, cardiac monitor, continuous pulse ox and blood pressure Approach: midline Location: L4-5 Injection technique: single-shot Needle Needle type: Pencan  Needle gauge: 24 G Needle length: 9 cm Needle insertion depth: 5 cm Assessment Sensory level: T4 Additional Notes Pt. Tolerated procedure well. Adequate sensory level.

## 2013-01-25 NOTE — Transfer of Care (Signed)
Immediate Anesthesia Transfer of Care Note  Patient: Kathleen Harrell  Procedure(s) Performed: Procedure(s): Repeat  CESAREAN SECTION (N/A)  Patient Location: PACU  Anesthesia Type:Spinal  Level of Consciousness: awake, alert  and oriented  Airway & Oxygen Therapy: Patient Spontanous Breathing  Post-op Assessment: Report given to PACU RN and Post -op Vital signs reviewed and stable  Post vital signs: Reviewed and stable  Complications: No apparent anesthesia complications

## 2013-01-25 NOTE — H&P (Signed)
Obstetric History and Physical  Kathleen Harrell is a 32 y.o. [phone] with IUP at [redacted]w[redacted]d presenting for scheduled repeat cesarean section (third).   Prenatal Course Source of Care: [facility] with onset of care at 14 weeks Pregnancy complications or risks: Patient Active Problem List   Diagnosis Date Noted  . Anemia, iron deficiency 12/07/2012  . Supervision of normal intrauterine pregnancy in multigravida 08/03/2012  . Previous cesarean delivery affecting pregnancy, antepartum 08/03/2012   She plans to bottle feed She desires Depo-Provera for postpartum contraception.   Prenatal labs and studies: ABO, Rh: --/--/O POS, O POS (11/04 1510) Antibody: NEG (11/04 1510) Rubella: 1.99 (05/14 1417) RPR: NON REACTIVE (11/04 1510)  HBsAg: NEGATIVE (05/14 1417)  HIV: NON REACTIVE (08/06 1258)  GBS: Neg  (10/15) 1 hr Glucola  88 Genetic screening normal Anatomy US normal  Prenatal Transfer Tool  Maternal Diabetes: No Genetic Screening: Normal Maternal Ultrasounds/Referrals: Normal Fetal Ultrasounds or other Referrals:  None Maternal Substance Abuse:  No Significant Maternal Medications:  None Significant Maternal Lab Results: Lab values include: Group B Strep negative  Past Medical History  Diagnosis Date  . GERD (gastroesophageal reflux disease)     Past Surgical History  Procedure Laterality Date  . Cesarean section      OB History  Gravida Para Term Preterm AB SAB TAB Ectopic Multiple Living  4 2 2  1 1   1 3     # Outcome Date GA Lbr Len/2nd Weight Sex Delivery Anes PTL Lv  4 CUR           3 TRM 05/2009 [redacted]w[redacted]d  5 lb 9 oz (2.523 kg) M CS EPI       Comments: no complications, born at [facility]  2 SAB 2006          1A TRM 05/2000 [redacted]w[redacted]d  5 lb 5 oz (2.41 kg) M CS EPI       Comments: fraternal twins, one twin has PROM, has c/s due to breech, born at [place], [place]  1B  05/2000 [redacted]w[redacted]d  4 lb 9 oz (2.07 kg) M          History   Social History  . Marital Status: Single    Spouse  Name: N/A    Number of Children: N/A  . Years of Education: N/A   Social History Main Topics  . Smoking status: Never Smoker   . Smokeless tobacco: Never Used  . Alcohol Use: No  . Drug Use: No  . Sexual Activity: Yes    Birth Control/ Protection: None   Other Topics Concern  . None   Social History Narrative  . None    Family History  Problem Relation Age of Onset  . Diabetes Mother     Facility-administered medications prior to admission  Medication Dose Route Frequency Provider Last Rate Last Dose  . influenza  inactive virus vaccine (FLUZONE/FLUARIX) injection 0.5 mL  0.5 mL Intramuscular Once Archie Patten, CNM       Prescriptions prior to admission  Medication Sig Dispense Refill  . omeprazole (PRILOSEC) 20 MG capsule Take 20 mg by mouth daily as needed.      . Prenatal Multivit-Min-Fe-FA (PRENATAL VITAMINS) 0.8 MG tablet Take 1 tablet by mouth daily.  30 tablet  12    Allergies  Allergen Reactions  . Penicillins Other (See Comments)    Dizzy, shaky    Review of Systems: Negative except for what is mentioned in HPI.  Physical Exam: BP 109/82  Pulse 84  Temp(Src) 98.4 F (36.9 C) (Oral)  Resp 18  SpO2 100%  LMP 04/27/2012 FHR 145 GENERAL: Well-developed, well-nourished female in no acute distress.  LUNGS: Clear to auscultation bilaterally.  HEART: Regular rate and rhythm. ABDOMEN: Soft, nontender, nondistended, gravid. Well healed Pfannenstiel incision. EXTREMITIES: Nontender, no edema, 2+ distal pulses.   Pertinent Labs/Studies:   Results for orders placed during the hospital encounter of 01/25/13 (from the past 336 hour(s))  ABO/RH   Collection Time    01/24/13  3:10 PM      Result Value Range   ABO/RH(D) O POS    Results for orders placed during the hospital encounter of 01/24/13 (from the past 336 hour(s))  CBC   Collection Time    01/24/13  3:10 PM      Result Value Range   WBC 12.2 (*) 4.0 - 10.5 K/uL   RBC 3.97  3.87 - 5.11 MIL/uL    Hemoglobin 10.1 (*) 12.0 - 15.0 g/dL   HCT 95.2 (*) 84.1 - 32.4 %   MCV 76.3 (*) 78.0 - 100.0 fL   MCH 25.4 (*) 26.0 - 34.0 pg   MCHC 33.3  30.0 - 36.0 g/dL   RDW 40.1  02.7 - 25.3 %   Platelets 179  150 - 400 K/uL  RPR   Collection Time    01/24/13  3:10 PM      Result Value Range   RPR NON REACTIVE  NON REACTIVE  TYPE AND SCREEN   Collection Time    01/24/13  3:10 PM      Result Value Range   ABO/RH(D) O POS     Antibody Screen NEG     Sample Expiration 01/27/2013    Results for orders placed in visit on 01/18/13 (from the past 336 hour(s))  POCT URINALYSIS DIP (DEVICE)   Collection Time    01/18/13  8:54 AM      Result Value Range   Glucose, UA NEGATIVE  NEGATIVE mg/dL   Bilirubin Urine NEGATIVE  NEGATIVE   Ketones, ur NEGATIVE  NEGATIVE mg/dL   Specific Gravity, Urine 1.020  1.005 - 1.030   Hgb urine dipstick NEGATIVE  NEGATIVE   pH 7.0  5.0 - 8.0   Protein, ur NEGATIVE  NEGATIVE mg/dL   Urobilinogen, UA 0.2  0.0 - 1.0 mg/dL   Nitrite NEGATIVE  NEGATIVE   Leukocytes, UA NEGATIVE  NEGATIVE     Assessment : Kathleen Harrell is a 32 y.o. [id number] at [redacted]w[redacted]d being admitted for scheduled repeat cesarean section.  Plan: The risks of repeat cesarean section discussed with the patient included but were not limited to: bleeding which may require transfusion or reoperation; infection which may require antibiotics; injury to bowel, bladder, ureters or other surrounding organs; injury to the fetus; need for additional procedures including hysterectomy in the event of a life-threatening hemorrhage; placental abnormalities wth subsequent pregnancies, incisional problems, thromboembolic phenomenon and other postoperative/anesthesia complications. The patient concurred with the proposed plan, giving informed written consent for the procedure.   Patient has been NPO since midnight she will remain NPO for procedure. Anesthesia and OR aware. Preoperative prophylactic antibiotics and SCDs  ordered on call to the OR.  To OR when ready.  Jaynie Collins, MD, FACOG Attending Obstetrician & Gynecologist Faculty Practice, [facility] [place]

## 2013-01-25 NOTE — Lactation Note (Signed)
This note was copied from the chart of Kathleen [facility]. Lactation Consultation Note  Patient Name: Kathleen Harrell Today's Date: 01/25/2013 Reason for consult: Other (Comment);Initial assessment (charting for exclusion)   Maternal Data Formula Feeding for Exclusion: Yes Reason for exclusion: Mother's choice to formula feed on admision  Feeding Feeding Type: Bottle Fed - Formula Nipple Type: Regular  LATCH Score/Interventions                      Lactation Tools Discussed/Used     Consult Status Consult Status: Complete    Lynda Rainwater 01/25/2013, 3:11 PM

## 2013-01-25 NOTE — Consult Note (Signed)
Neonatology Note:  Attendance at C-section:  I was asked by Dr. Anyanwu to attend this repeat C/S at term. The mother is a G4P2A1 O pos, GBS neg with an uncomplicated pregnancy. ROM at delivery, fluid clear. Infant vigorous with good spontaneous cry and tone. Needed bulb suctioning for removal of clear secretions. Ap 8/9. Lungs clear to ausc in DR. Baby appeas SGA, parents counseled about risk of hypothermia and hypoglycemia. To CN to care of Pediatrician.  Kathleen Normoyle C. Kasi Lasky, MD  

## 2013-01-26 ENCOUNTER — Encounter (HOSPITAL_COMMUNITY): Payer: Self-pay | Admitting: Obstetrics & Gynecology

## 2013-01-26 LAB — BIRTH TISSUE RECOVERY COLLECTION (PLACENTA DONATION)

## 2013-01-26 LAB — CBC
Hemoglobin: 9.3 g/dL — ABNORMAL LOW (ref 12.0–15.0)
MCH: 25 pg — ABNORMAL LOW (ref 26.0–34.0)
MCV: 76.3 fL — ABNORMAL LOW (ref 78.0–100.0)
Platelets: 166 10*3/uL (ref 150–400)
RBC: 3.72 MIL/uL — ABNORMAL LOW (ref 3.87–5.11)
RDW: 14.6 % (ref 11.5–15.5)
WBC: 15.2 10*3/uL — ABNORMAL HIGH (ref 4.0–10.5)

## 2013-01-26 NOTE — Progress Notes (Signed)
Post-Op Day 1, RLTCS   Subjective:  No complaints, up ad lib, voiding and tolerating PO, lochia=menses, not yet passing gas.   Objective:  Blood pressure 110/71, pulse 73, temperature 98.1 F (36.7 C), temperature source Oral, resp. rate 16, height 5\' 4"  (1.626 m), weight 78.019 kg (172 lb), SpO2 94.00%, unknown if currently breastfeeding.  Physical Exam:  General: alert, cooperative and no distress  Lochia: normal flow  Chest: CTAB  Heart: RRR no m/r/g  Abdomen: +BS, soft, appropriately tender. Honeycomb dressing saturated, no evidence of active bleeding, no surrounding erythema  Uterine Fundus: firm  DVT Evaluation: No evidence of DVT seen on physical exam.  Extremities: no edema   CBC- 11/6 0600 (preop hgb 10.1)    Component Value Date/Time   WBC 15.2* 01/26/2013 0600   RBC 3.72* 01/26/2013 0600   HGB 9.3* 01/26/2013 0600   HCT 28.4* 01/26/2013 0600   PLT 166 01/26/2013 0600   MCV 76.3* 01/26/2013 0600   MCH 25.0* 01/26/2013 0600   MCHC 32.7 01/26/2013 0600   RDW 14.6 01/26/2013 0600   LYMPHSABS 2.2 08/03/2012 1417   MONOABS 0.6 08/03/2012 1417   EOSABS 0.2 08/03/2012 1417   BASOSABS 0.0 08/03/2012 1417     Assessment/Plan:  1. S/p RLTCS. Doing well. Continue routine postpartum care.  2. Plan for discharge tomorrow if meeting all discharge criteria.  3. She is bottle feeding.  4. She plans to use Depo provera for postpartum contraception. Discussed long-acting methods of contraception and she would like more information on these to read over.   LOS: 1 day   Harrell, Kathleen  01/26/2013, 7:24 AM   I have seen and examined this patient and I agree with the above. Cam Hai 7:38 AM 01/26/2013

## 2013-01-26 NOTE — Progress Notes (Signed)
UR chart review completed.  

## 2013-01-27 MED ORDER — OXYCODONE-ACETAMINOPHEN 5-325 MG PO TABS: 1.0000 | ORAL_TABLET | ORAL | Status: DC | PRN

## 2013-01-27 MED ORDER — IBUPROFEN 600 MG PO TABS: 600.0000 mg | ORAL_TABLET | Freq: Four times a day (QID) | ORAL | Status: DC

## 2013-01-27 NOTE — Progress Notes (Addendum)
Post Partum/POst op Day 2 Subjective: no complaints, up ad lib, voiding and tolerating PO, burping but HAS NOT PASSED GAS;  small lochia, plans to bottle feed, Depo-Provera DESIRES DISCHARGE Objective: Blood pressure 106/72, pulse 85, temperature 98.5 F (36.9 C), temperature source Oral, resp. rate 16, weight 78.019 kg (172 lb), last menstrual period 04/27/2012, SpO2 99.00%, unknown if currently breastfeeding.  Physical Exam:  General: alert, cooperative and no distress Lochia:normal flow Chest: CTAB Heart: RRR no m/r/g Abdomen: +BS (hypoactive lower quads), soft, nontender, dsg dry/intact/no erythema Uterine Fundus: firm DVT Evaluation: No evidence of DVT seen on physical exam. Extremities: tracea   Recent Labs  01/24/13 1510 01/26/13 0600  HGB 10.1* 9.3*  HCT 30.3* 28.4*    Assessment/Plan:  POD#2 RLTCS, doing well except has not passed flatus: advised to walk around this am Plan for D/C unless she does not pass gas   LOS: 2 days   CRESENZO-DISHMAN,Richey Doolittle 01/27/2013, 7:21 AM

## 2013-01-27 NOTE — Discharge Summary (Signed)
Obstetric Discharge Summary Reason for Admission: cesarean section Prenatal Procedures: none Intrapartum Procedures: cesarean: low cervical, transverse Postpartum Procedures: none Complications-Operative and Postpartum: none Hemoglobin  Date Value Range Status  01/26/2013 9.3* 12.0 - 15.0 g/dL Final     HCT  Date Value Range Status  01/26/2013 28.4* 36.0 - 46.0 % Final    Physical Exam:  General: alert, cooperative and no distress Lochia: appropriate Uterine Fundus: firm Incision: healing well, no significant drainage, no dehiscence, no significant erythema DVT Evaluation: No evidence of DVT seen on physical exam.  Discharge Diagnoses: Term Pregnancy-delivered  Discharge Information: Date: 01/27/2013 Activity: pelvic rest Diet: routine Medications: Ibuprofen and Percocet Condition: stable Instructions: refer to practice specific booklet Discharge to: home   Newborn Data: Live born female  Birth Weight: 5 lb 8 oz (2495 g) APGAR: 8, 9  Home with mother.  Hospital course: Pt was admitted at [redacted]w[redacted]d for scheduled repeat cesarean section. Her operative and postoperative course were uncomplicated. She is bottle feeding. She desires Depo provera for postpartum contraception. She will follow up at [facility] for her postpartum care. She was discharged to home in stable condition.   [name], [name] 01/27/2013, 8:43 PM  I have seen and examined this patient and agree the above assessment. CRESENZO-DISHMAN,Hewitt Garner 02/01/2013 9:10 AM

## 2013-02-01 NOTE — Discharge Summary (Signed)
Attestation of Attending Supervision of Advanced Practitioner (CNM/NP): Evaluation and management procedures were performed by the Advanced Practitioner under my supervision and collaboration.  I have reviewed the Advanced Practitioner's note and chart, and I agree with the management and plan.  Naturi Alarid 02/01/2013 9:56 AM   

## 2013-02-02 ENCOUNTER — Ambulatory Visit (INDEPENDENT_AMBULATORY_CARE_PROVIDER_SITE_OTHER): Payer: Medicaid Other | Admitting: General Practice

## 2013-02-02 VITALS — BP 125/84 | HR 86 | Temp 98.8°F | Ht 64.0 in | Wt 162.7 lb

## 2013-02-02 DIAGNOSIS — Z3049 Encounter for surveillance of other contraceptives: Secondary | ICD-10-CM

## 2013-02-02 DIAGNOSIS — IMO0001 Reserved for inherently not codable concepts without codable children: Secondary | ICD-10-CM

## 2013-02-02 MED ORDER — MEDROXYPROGESTERONE ACETATE 104 MG/0.65ML ~~LOC~~ SUSP
104.0000 mg | Freq: Once | SUBCUTANEOUS | Status: AC
  Administered 2013-02-02: 104 mg via SUBCUTANEOUS

## 2013-03-02 ENCOUNTER — Ambulatory Visit (INDEPENDENT_AMBULATORY_CARE_PROVIDER_SITE_OTHER): Payer: Medicaid Other | Admitting: Obstetrics & Gynecology

## 2013-03-02 ENCOUNTER — Encounter: Payer: Self-pay | Admitting: Obstetrics & Gynecology

## 2013-03-02 NOTE — Progress Notes (Signed)
Patient ID: Kathleen Harrell, female   DOB: [birth year], 32 y.o.   MRN: [id number] Subjective:     AMELI Harrell is a 32 y.o. female who presents for a postpartum visit. She is 5 weeks postpartum following a low cervical transverse Cesarean section. I have fully reviewed the prenatal and intrapartum course. The delivery was at 39 gestational weeks. Outcome: repeat cesarean section, low transverse incision. Anesthesia: spinal. Postpartum course has been uncomplicated. Baby's course has been uncompicated. She is to be scheduled to see a ENT due to some breathing issues. Baby is feeding by bottle. Bleeding thin lochia. Bowel function is normal. Bladder function is normal. Patient is not sexually active. Contraception method is Depo-Provera injections. Postpartum depression screening: negative.  The following portions of the patient's history were reviewed and updated as appropriate: allergies, current medications, past family history, past medical history, past social history, past surgical history and problem list.  Review of Systems A comprehensive review of systems was negative.   Objective:    BP 115/80  Pulse 92  Temp(Src) 98.6 F (37 C) (Oral)  Ht 5\' 4"  (1.626 m)  Wt 161 lb (73.029 kg)  BMI 27.62 kg/m2  Breastfeeding? No  General:  alert and no distress           Abdomen: soft, non-tender; bowel sounds normal; no masses,  no organomegaly and incision: clean, dry and intact                           Assessment:     6 weeks postpartum exam. Pap smear not done at today's visit.   Plan:    1. Contraception: Depo-Provera injections 2. F/u as scheduled for next injection 3. Follow up in: 6 months or as needed.

## 2013-03-02 NOTE — Patient Instructions (Signed)

## 2013-04-26 ENCOUNTER — Ambulatory Visit (INDEPENDENT_AMBULATORY_CARE_PROVIDER_SITE_OTHER): Payer: Medicaid Other | Admitting: *Deleted

## 2013-04-26 VITALS — BP 115/80 | HR 92 | Temp 98.7°F | Wt 166.5 lb

## 2013-04-26 DIAGNOSIS — IMO0001 Reserved for inherently not codable concepts without codable children: Secondary | ICD-10-CM

## 2013-04-26 DIAGNOSIS — Z3049 Encounter for surveillance of other contraceptives: Secondary | ICD-10-CM

## 2013-04-26 MED ORDER — MEDROXYPROGESTERONE ACETATE 104 MG/0.65ML ~~LOC~~ SUSP
104.0000 mg | Freq: Once | SUBCUTANEOUS | Status: AC
  Administered 2013-04-26: 104 mg via SUBCUTANEOUS

## 2013-07-19 ENCOUNTER — Ambulatory Visit: Payer: Medicaid Other

## 2013-07-20 ENCOUNTER — Ambulatory Visit (INDEPENDENT_AMBULATORY_CARE_PROVIDER_SITE_OTHER): Payer: Medicaid Other | Admitting: *Deleted

## 2013-07-20 VITALS — BP 126/62 | HR 89 | Wt 171.5 lb

## 2013-07-20 DIAGNOSIS — IMO0001 Reserved for inherently not codable concepts without codable children: Secondary | ICD-10-CM

## 2013-07-20 DIAGNOSIS — Z3049 Encounter for surveillance of other contraceptives: Secondary | ICD-10-CM

## 2013-07-20 MED ORDER — MEDROXYPROGESTERONE ACETATE 104 MG/0.65ML ~~LOC~~ SUSP
104.0000 mg | Freq: Once | SUBCUTANEOUS | Status: AC
  Administered 2013-07-20: 104 mg via SUBCUTANEOUS

## 2013-07-24 IMAGING — US US OB FOLLOW-UP
1 series · 12 of 28 positions shown · non-contrast
Comparison: none

[Series 1: us ob comp +14 wk · 72 acquisitions, 12 frames shown]
[im 3/72]
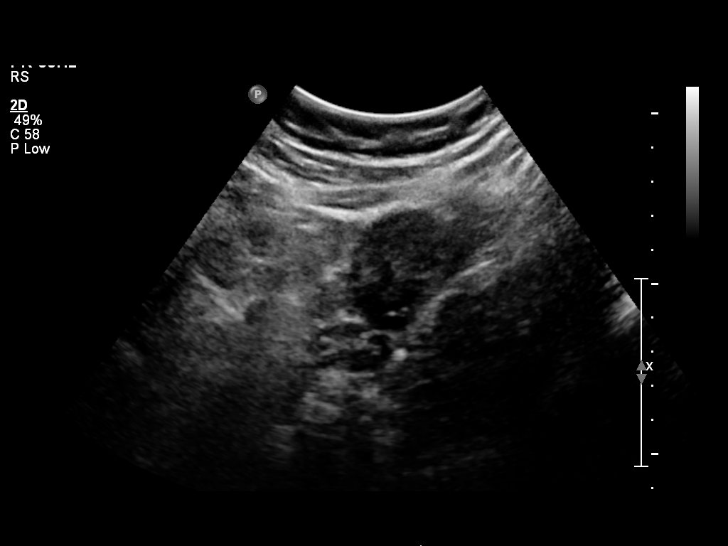
[im 8/72]
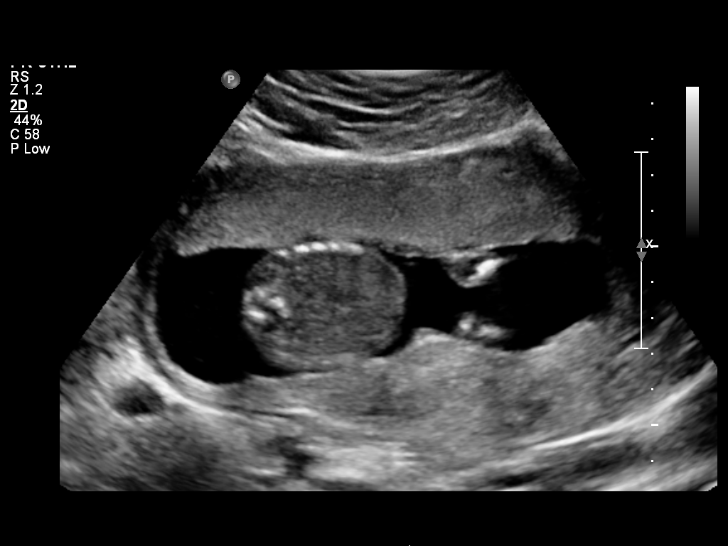
[im 14/72]
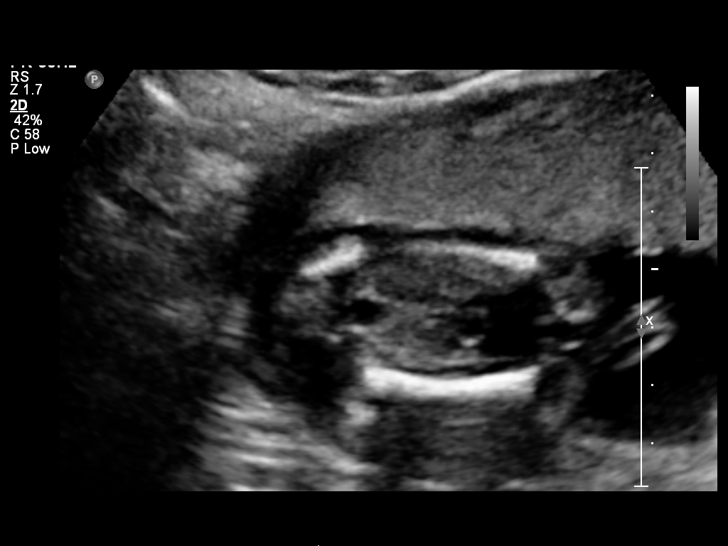
[im 22/72]
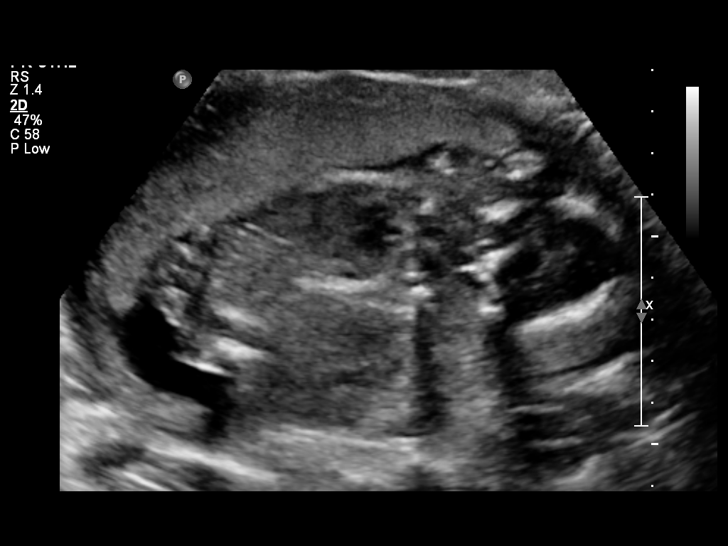
[im 27/72]
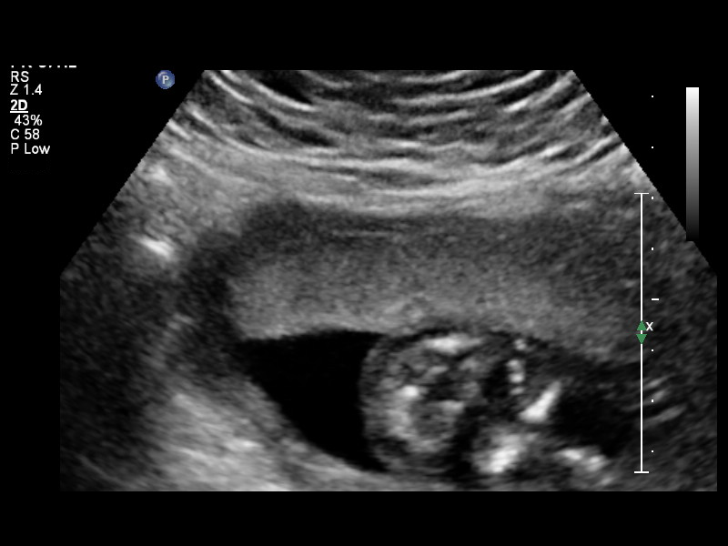
[im 32/72]
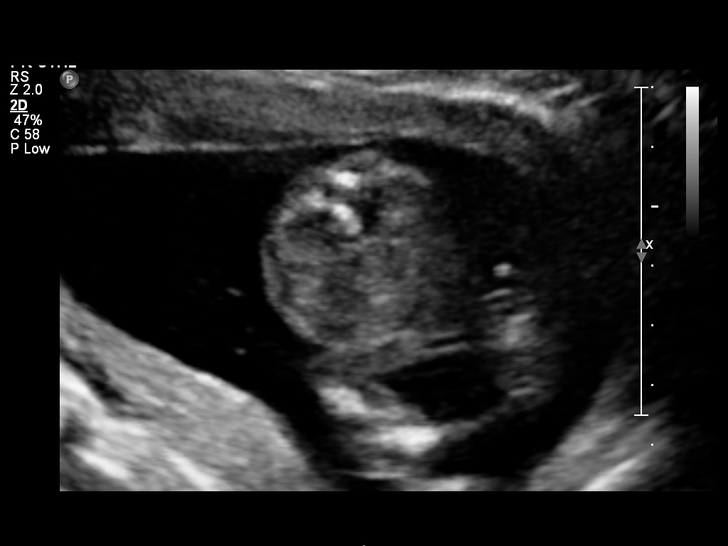
[im 40/72]
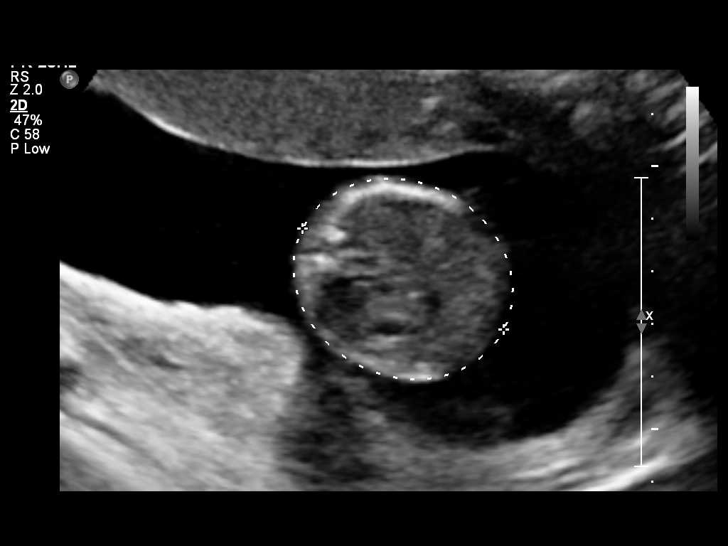
[im 45/72]
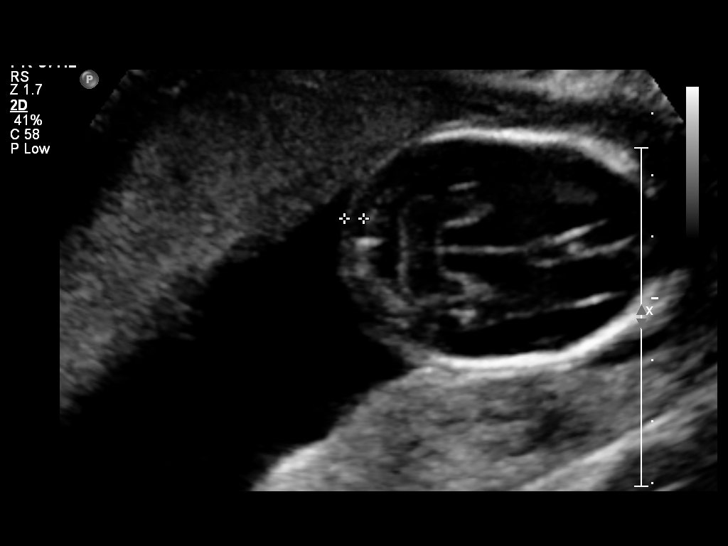
[im 50/72]
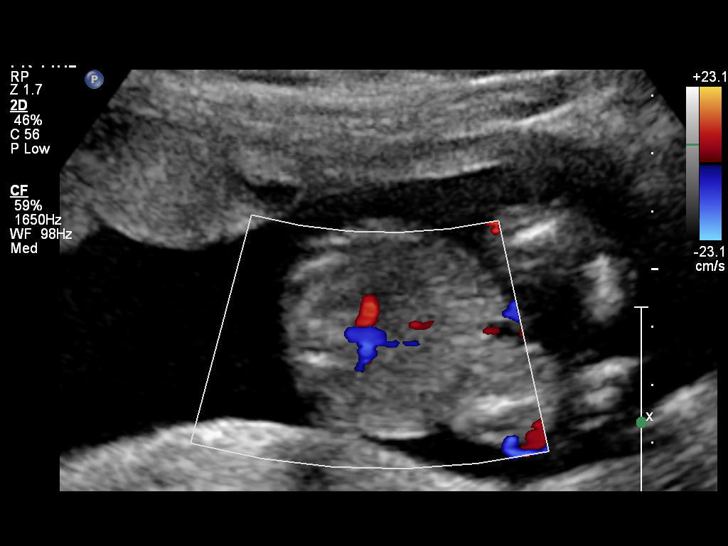
[im 58/72]
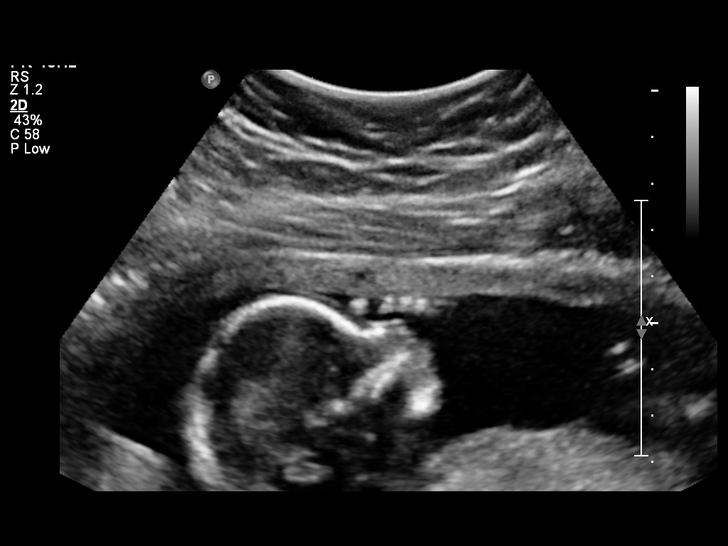
[im 64/72]
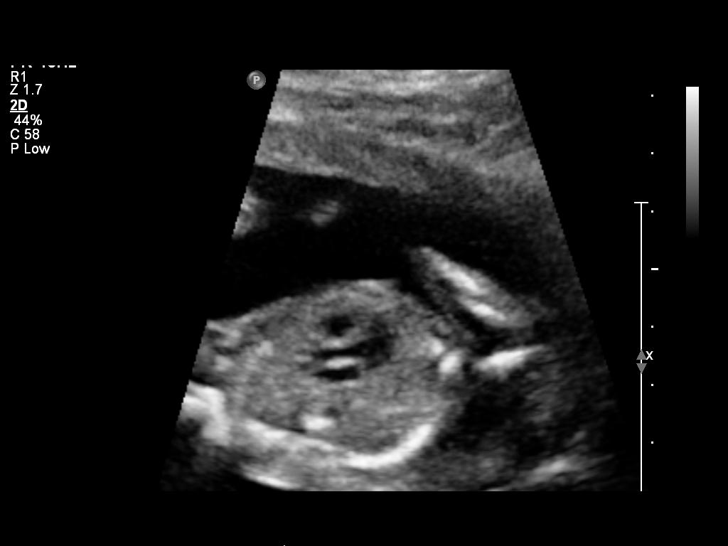
[im 69/72]
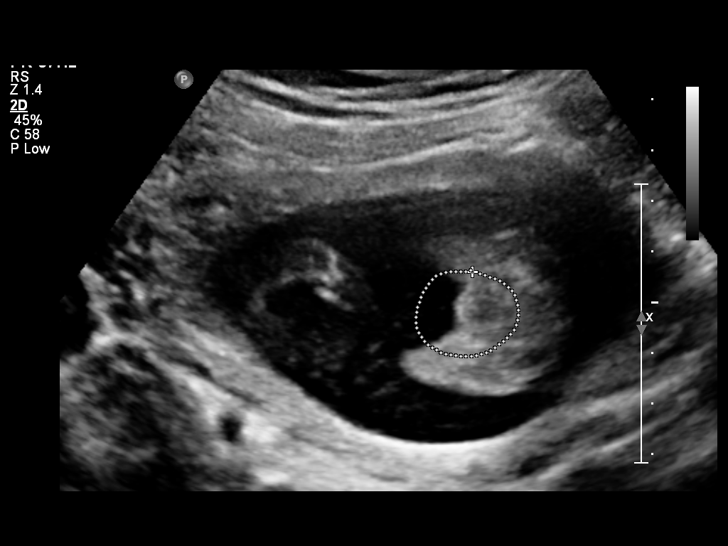

[12 of 28 positions shown; findings below may reference images not displayed]

OBSTETRICS REPORT
                    (Corrected Final 09/06/2012 [DATE])

Service(s) Provided

 US OB FOLLOW UP                                       76816.1
Indications

 Follow-up incomplete fetal anatomic evaluation
 Previous cesarean section x 2
Fetal Evaluation

 Num Of Fetuses:    1
 Fetal Heart Rate:  144                          bpm
 Cardiac Activity:  Observed
 Presentation:      Transverse, head to
                    maternal left
 Placenta:          Anterior, above cervical os
 P. Cord            Visualized, central
 Insertion:

 Amniotic Fluid
 AFI FV:      Subjectively within normal limits
                                             Larg Pckt:     5.8  cm
Biometry

 BPD:     37.5  mm     G. Age:  17w 3d                CI:        66.88   70 - 86
                                                      FL/HC:      20.4   15.8 -
                                                                         18
 HC:     146.9  mm     G. Age:  17w 6d       22  %    HC/AC:      1.15   1.07 -

 AC:     127.8  mm     G. Age:  18w 3d       49  %    FL/BPD:
 FL:      29.9  mm     G. Age:  19w 2d       77  %    FL/AC:      23.4   20 - 24
 HUM:     27.7  mm     G. Age:  18w 6d       71  %
 NFT:      3.1  mm

 Est. FW:     250  gm      0 lb 9 oz     53  %
Gestational Age

 LMP:           18w 2d        Date:  04/27/12                 EDD:   02/01/13
 U/S Today:     18w 2d                                        EDD:   02/01/13
 Best:          18w 2d     Det. By:  LMP  (04/27/12)          EDD:   02/01/13
Anatomy
 Cranium:          Appears normal         Aortic Arch:      Not well visualized
 Fetal Cavum:      Appears normal         Ductal Arch:      Appears normal
 Ventricles:       Appears normal         Diaphragm:        Appears normal
 Choroid Plexus:   Appears normal         Stomach:          Appears normal
 Cerebellum:       Appears normal         Abdomen:          Appears normal
 Posterior Fossa:  Appears normal         Abdominal Wall:   Appears nml (cord
                                                            insert, abd wall)
 Nuchal Fold:      Appears normal         Cord Vessels:     Appears normal (3
                                                            vessel cord)
 Face:             Appears normal         Kidneys:          Appear normal
                   (orbits and profile)
 Lips:             Appears normal         Bladder:          Appears normal
 Heart:            Not well visualized    Spine:            Appears normal
 RVOT:             Appears normal         Lower             Appears normal
                                          Extremities:
 LVOT:             Not well visualized    Upper             Appears normal
                                          Extremities:

 Other:  Heels previously visualized. Female gender. Rt. 5th digit visualized.
         Technically difficult due to fetal position.
Cervix Uterus Adnexa

 Cervical Length:    4.3      cm

 Cervix:       Normal appearance by transabdominal scan.
 Left Ovary:    Within normal limits.
 Right Ovary:   Not visualized.

 Adnexa:     No abnormality visualized.
Impression

 Single living IUP with assigned GA of 18w 2d. Appropriate
 fetal growth.
 No fetal anatomic abnormality is identified.
 The fetal heart, LVOT, and aortic arch are not well visualized.
 Normal amniotic fluid volume and cervical length.
Recommendations

 Follow-up ultrasound in 2 to 3 weeks could be considered for
 hopeful improved visualization of fetal heart, LVOT, and
 aortic arch.

 questions or concerns.
                 Attending Physician, MERVAT

## 2013-10-11 ENCOUNTER — Ambulatory Visit (INDEPENDENT_AMBULATORY_CARE_PROVIDER_SITE_OTHER): Payer: Medicaid Other | Admitting: General Practice

## 2013-10-11 VITALS — BP 125/80 | HR 98 | Ht 64.0 in | Wt 174.0 lb

## 2013-10-11 DIAGNOSIS — Z3042 Encounter for surveillance of injectable contraceptive: Secondary | ICD-10-CM

## 2013-10-11 DIAGNOSIS — Z3049 Encounter for surveillance of other contraceptives: Secondary | ICD-10-CM | POA: Diagnosis present

## 2013-10-11 MED ORDER — MEDROXYPROGESTERONE ACETATE 104 MG/0.65ML ~~LOC~~ SUSP
104.0000 mg | Freq: Once | SUBCUTANEOUS | Status: AC
Start: 2013-10-11 — End: 2013-10-11
  Administered 2013-10-11: 104 mg via SUBCUTANEOUS

## 2013-10-27 ENCOUNTER — Encounter (HOSPITAL_COMMUNITY): Payer: Self-pay | Admitting: Emergency Medicine

## 2013-10-27 ENCOUNTER — Emergency Department (HOSPITAL_COMMUNITY)
Admission: EM | Admit: 2013-10-27 | Discharge: 2013-10-27 | Disposition: A | Discharge status: Home / Self Care | Payer: Medicaid Other | Attending: Emergency Medicine | Admitting: Emergency Medicine

## 2013-10-27 DIAGNOSIS — M654 Radial styloid tenosynovitis [de Quervain]: Secondary | ICD-10-CM

## 2013-10-27 DIAGNOSIS — M25539 Pain in unspecified wrist: Secondary | ICD-10-CM | POA: Insufficient documentation

## 2013-10-27 DIAGNOSIS — Z880 Allergy status to penicillin: Secondary | ICD-10-CM | POA: Diagnosis not present

## 2013-10-27 DIAGNOSIS — Z8719 Personal history of other diseases of the digestive system: Secondary | ICD-10-CM | POA: Diagnosis not present

## 2013-10-27 MED ORDER — IBUPROFEN 800 MG PO TABS: 800.0000 mg | ORAL_TABLET | Freq: Three times a day (TID) | ORAL | Status: DC | PRN

## 2013-10-27 MED ORDER — HYDROCODONE-ACETAMINOPHEN 5-325 MG PO TABS: 1.0000 | ORAL_TABLET | ORAL | Status: DC | PRN

## 2013-10-27 NOTE — ED Notes (Addendum)
Pt reports having pain to rt arm. Pt does a lot of repeated motion due to her profession. Pt states pain is worse when she is lifting something or when she is moving her thumb. Pt rates pain 7/10. Pt denies taking anything for pain at home. Pt currently texting with rt Harrel.

## 2013-10-27 NOTE — ED Provider Notes (Signed)
Medical screening examination/treatment/procedure(s) were performed by non-physician practitioner and as supervising physician I was immediately available for consultation/collaboration.   EKG Interpretation None        David H Yao, MD 10/27/13 2331 

## 2013-10-27 NOTE — ED Notes (Signed)
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 Pain is worse with certain movements and began Monday.

## 2013-10-27 NOTE — ED Provider Notes (Signed)
CSN: [id number]     Arrival date & time 10/27/13  1946 History  This chart was scribed for Kathleen DredgeEmily Felis Harrell, [name] working with Richardean Canalavid H Yao, MD by Quintella ReichertMatthew Underwood, ED Scribe. This patient was seen in room TR10C/TR10C and the patient's care was started at 8:58 PM.   Chief Complaint  Patient presents with  . Arm Pain    The history is provided by the patient. No language interpreter was used.    HPI Comments: Kathleen Harrell is a 33 y.o. female who presents to the Emergency Department complaining of right wrist and thumb pain that began that began 4 days ago.  Pt states pain occasionally radiates up to her right shoulder.  It is constant and worsened by moving her thumb down and lifting movements.  She denies weakness numbness, or tingling, or fevers.  She denies pain to any other joints or other areas including neck.  She denies fevers or rash.  She denies recent changes to medications.  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  She had not been working for the 3 days prior to onset of pain.  Pt admits to h/o "a problem with my wrist" when she was 15 and was diagnosed with possible carpal tunnel.  She denies any other previous h/o similar pain.   Past Medical History  Diagnosis Date  . GERD (gastroesophageal reflux disease)     Past Surgical History  Procedure Laterality Date  . Cesarean section    . Cesarean section N/A 01/25/2013    Procedure: Repeat  CESAREAN SECTION;  Surgeon: Tereso NewcomerUgonna A Anyanwu, MD;  Location: [facility];  Service: Obstetrics;  Laterality: N/A;    Family History  Problem Relation Age of Onset  . Diabetes Mother     History  Substance Use Topics  . Smoking status: Never Smoker   . Smokeless tobacco: Never Used  . Alcohol Use: No    OB History   Grav Para Term Preterm Abortions TAB SAB Ect Mult Living   4 3 3  1  1  1 4        Review of Systems  Constitutional: Negative for fever.  Musculoskeletal: Positive for arthralgias (right wrist and thumb). Negative  for neck pain.  Skin: Negative for rash and wound.  Allergic/Immunologic: Negative for immunocompromised state.  Neurological: Negative for weakness and numbness.  Hematological: Does not bruise/bleed easily.      Allergies  Penicillins  Home Medications   Prior to Admission medications   Medication Sig Start Date End Date Taking? Authorizing Provider  ibuprofen (ADVIL,MOTRIN) 600 MG tablet Take 1 tablet (600 mg total) by mouth every 6 (six) hours. 01/27/13   Jacklyn ShellFrances Cresenzo-Dishmon, CNM  oxyCODONE-acetaminophen (PERCOCET/ROXICET) 5-325 MG per tablet Take 1 tablet by mouth every 4 (four) hours as needed. 01/27/13   Deirdre Colin Mulders Poe, CNM  Prenatal Multivit-Min-Fe-FA (PRENATAL VITAMINS) 0.8 MG tablet Take 1 tablet by mouth daily. 09/28/12   Heather Alger Memosonovan Hogan, CNM   BP 127/71  Pulse 100  Temp(Src) 99 F (37.2 C) (Oral)  Resp 18  SpO2 100%  LMP 10/22/2013  Physical Exam  Nursing note and vitals reviewed. Constitutional: She appears well-developed and well-nourished. No distress.  HENT:  Head: Normocephalic and atraumatic.  Neck: Neck supple.  Pulmonary/Chest: Effort normal.  Musculoskeletal:       Hands: Negative Phalen's. Negative Tinel's. Positive Finkelstein's.  Right Hirschhorn: Radial pulse intact. Full AROM. Sensation intact.  Capillary refill < 2 seconds.   Neurological: She is  alert.  Skin: She is not diaphoretic.    ED Course  Procedures (including critical care time)  DIAGNOSTIC STUDIES: Oxygen Saturation is 100% on room air, normal by my interpretation.    COORDINATION OF CARE: 9:04 PM-Informed pt that symptoms are likely due to De Quervain's tenosynovitis.  Discussed treatment plan which includes thumb splint and f/u with [facility] specialist with pt at bedside and pt agreed to plan.     Labs Review Labs Reviewed - No data to display  Imaging Review No results found.   EKG Interpretation None      MDM   Final diagnoses:  De Quervain's tenosynovitis,  right    Afebrile, nontoxic patient with right thumb and radial wrist pain without injury.  No focal bony tenderness.  Neurovascularly intact.  Finkelstein test +.  Doubt any benefit from emergent imaging at this time.   D/C home with norco, ibuprofen, Merk follow up (Dr Amanda Pea).  Discussed result, findings, treatment, and follow up  with patient.  Pt given return precautions.  Pt verbalizes understanding and agrees with plan.         I personally performed the services described in this documentation, which was scribed in my presence. The recorded information has been reviewed and is accurate.   Kathleen Dredge, PA-C 10/27/13 2201

## 2013-10-27 NOTE — Discharge Instructions (Signed)
Read the information below.  Use the prescribed medication as directed.  Please discuss all new medications with your pharmacist.  Do not take additional tylenol while taking the prescribed pain medication to avoid overdose.  You may return to the Emergency Department at any time for worsening condition or any new symptoms that concern you.  If there is any possibility that you might be pregnant or if you are nursing, please let your health care provider know and discuss this with the pharmacist to ensure medication safety.  If you develop uncontrolled pain, weakness or numbness of the extremity, severe discoloration of the skin, or you are unable to move your fingers, return to the ER for a recheck.       De Quervain's Tenosynovitis De Quervain's tenosynovitis involves inflammation of one or two tendon linings (sheaths) or strain of one or two tendons to the thumb: extensor pollicis brevis (EPB), or abductor pollicis longus (APL). This causes pain on the side of the wrist and base of the thumb. Tendon sheaths secrete a fluid that lubricates the tendon, allowing the tendon to move smoothly. When the sheath becomes inflamed, the tendon cannot move freely in the sheath. Both the EPB and APL tendons are important for proper use of the Coop. The EPB tendon is important for straightening the thumb. The APL tendon is important for moving the thumb away from the index finger (abducting). The two tendons pass through a small tube (canal) in the wrist, near the base of the thumb. When the tendons become inflamed, pain is usually felt in this area. SYMPTOMS   Pain, tenderness, swelling, warmth, or redness over the base of the thumb and thumb side of the wrist.  Pain that gets worse when straightening the thumb.  Pain that gets worse when moving the thumb away from the index finger, against resistance.  Pain with pinching or gripping.  Locking or catching of the thumb.  Limited motion of the  thumb.  Crackling sound (crepitation) when the tendon or thumb is moved or touched.  Fluid-filled cyst in the area of the base of the thumb. CAUSES   Tenosynovitis is often linked with overuse of the wrist.  Tenosynovitis may be caused by repeated injury to the thumb muscle and tendon units, and with repeated motions of the [place] and wrist, due to friction of the tendon within the lining (sheath).  Tenosynovitis may also be due to a sudden increase in activity or change in activity. RISK INCREASES WITH:  Sports that involve repeated Mccaslin and wrist motions (golf, bowling, tennis, squash, racquetball).  Heavy labor.  Poor physical wrist strength and flexibility.  Failure to warm up properly before practice or play.  Female gender.  New mothers who hold their baby's head for long periods or lift infants with thumbs in the infant's armpit (axilla). PREVENTION  Warm up and stretch properly before practice or competition.  Allow enough time for rest and recovery between practices and competition.  Maintain appropriate conditioning:  Cardiovascular fitness.  Forearm, wrist, and Moates flexibility.  Muscle strength and endurance.  Use proper exercise technique. PROGNOSIS  This condition is usually curable within 6 weeks, if treated properly with non-surgical treatment and resting of the affected area.  RELATED COMPLICATIONS   Longer healing time if not properly treated or if not given enough time to heal.  Chronic inflammation, causing recurring symptoms of tenosynovitis. Permanent pain or restriction of movement.  Risks of surgery: infection, bleeding, injury to nerves (numbness of the thumb),  continued pain, incomplete release of the tendon sheath, recurring symptoms, cutting of the tendons, tendons sliding out of position, weakness of the thumb, thumb stiffness. TREATMENT  First, treatment involves the use of medicine and ice, to reduce pain and inflammation. Patients are  encouraged to stop or modify activities that aggravate the injury. Stretching and strengthening exercises may be advised. Exercises may be completed at home or with a therapist. You may be fitted with a brace or splint, to limit motion and allow the injury to heal. Your caregiver may also choose to give you a corticosteroid injection, to reduce the pain and inflammation. If non-surgical treatment is not successful, surgery may be needed. Most tenosynovitis surgeries are done as outpatient procedures (you go home the same day). Surgery may involve local, regional (whole arm), or general anesthesia.  MEDICATION   If pain medicine is needed, nonsteroidal anti-inflammatory medicines (aspirin and ibuprofen), or other minor pain relievers (acetaminophen), are often advised.  Do not take pain medicine for 7 days before surgery.  Prescription pain relievers are often prescribed only after surgery. Use only as directed and only as much as you need.  Corticosteroid injections may be given if your caregiver thinks they are needed. There is a limited number of times these injections may be given. COLD THERAPY   Cold treatment (icing) should be applied for 10 to 15 minutes every 2 to 3 hours for inflammation and pain, and immediately after activity that aggravates your symptoms. Use ice packs or an ice massage. SEEK MEDICAL CARE IF:   Symptoms get worse or do not improve in 2 to 4 weeks, despite treatment.  You experience pain, numbness, or coldness in the [place].  Blue, gray, or dark color appears in the fingernails.  Any of the following occur after surgery: increased pain, swelling, redness, drainage of fluids, bleeding in the affected area, or signs of infection.  New, unexplained symptoms develop. (Drugs used in treatment may produce side effects.) Document Released: 03/09/2005 Document Revised: 06/01/2011 Document Reviewed: 06/21/2008 [facility] Patient Information 2015 [employer]. This  information is not intended to replace advice given to you by your health care provider. Make sure you discuss any questions you have with your health care provider.

## 2014-01-22 ENCOUNTER — Encounter (HOSPITAL_COMMUNITY): Payer: Self-pay | Admitting: Emergency Medicine

## 2014-02-07 ENCOUNTER — Ambulatory Visit (INDEPENDENT_AMBULATORY_CARE_PROVIDER_SITE_OTHER): Payer: Medicaid Other | Admitting: Obstetrics & Gynecology

## 2014-02-07 ENCOUNTER — Encounter: Payer: Self-pay | Admitting: Obstetrics & Gynecology

## 2014-02-07 VITALS — BP 115/63 | HR 86 | Temp 98.8°F | Resp 20 | Ht 64.0 in | Wt 176.6 lb

## 2014-02-07 DIAGNOSIS — Z3009 Encounter for other general counseling and advice on contraception: Secondary | ICD-10-CM | POA: Diagnosis not present

## 2014-02-07 DIAGNOSIS — Z30430 Encounter for insertion of intrauterine contraceptive device: Secondary | ICD-10-CM

## 2014-02-07 DIAGNOSIS — Z30017 Encounter for initial prescription of implantable subdermal contraceptive: Secondary | ICD-10-CM

## 2014-02-07 DIAGNOSIS — Z308 Encounter for other contraceptive management: Secondary | ICD-10-CM | POA: Diagnosis not present

## 2014-02-07 DIAGNOSIS — S3769XS Other injury of uterus, sequela: Secondary | ICD-10-CM | POA: Diagnosis not present

## 2014-02-07 DIAGNOSIS — IMO0001 Reserved for inherently not codable concepts without codable children: Secondary | ICD-10-CM

## 2014-02-07 LAB — POCT PREGNANCY, URINE: Preg Test, Ur: NEGATIVE

## 2014-02-07 MED ORDER — IBUPROFEN 800 MG PO TABS: 800.0000 mg | ORAL_TABLET | Freq: Three times a day (TID) | ORAL | Status: DC | PRN

## 2014-02-07 MED ORDER — LEVONORGESTREL 20 MCG/24HR IU IUD: INTRAUTERINE_SYSTEM | Freq: Once | INTRAUTERINE | Status: DC

## 2014-02-07 MED ORDER — ETONOGESTREL 68 MG ~~LOC~~ IMPL
68.0000 mg | DRUG_IMPLANT | Freq: Once | SUBCUTANEOUS | Status: AC
  Administered 2014-02-07: 68 mg via SUBCUTANEOUS

## 2014-02-07 NOTE — Patient Instructions (Signed)
Return to clinic for any scheduled appointments or for any gynecologic concerns as needed.   

## 2014-02-07 NOTE — Progress Notes (Signed)
Pt desires to change birth control because she has gained too much weight on Depo Provera. Last dose of 104 mg given 10/11/13- was due 10/13-10/28.

## 2014-02-07 NOTE — Progress Notes (Signed)
    GYNECOLOGY CLINIC PROCEDURE NOTE  Kathleen Harrell is a 33 y.o. [phone] here for contraceptive counseling. Was on Depo Provera and gained too much weight. Wants to discuss other options.  Reviewed all forms of birth control options available including abstinence; over the counter/barrier methods; hormonal contraceptive medication including pill, patch, ring, injection,contraceptive implant; hormonal and nonhormonal IUDs; permanent sterilization options including vasectomy and the various tubal sterilization modalities. Risks and benefits reviewed.  Questions were answered.  Information was given to patient to review.  Patient desired Mirena IUD insertion today. No GYN concerns.  Last pap smear was on 08/03/12 and was normal with negative HRHPV.  Mirena IUD Insertion Procedure Note (Failed Attempt) Patient identified, informed consent performed.  Discussed risks of irregular bleeding, cramping, infection, malpositioning or misplacement of the IUD outside the uterus which may require further procedure such as laparoscopy. Time out was performed.  Urine pregnancy test negative.  Premedicated with Ibuprofen 800 mg po x 1.  Speculum placed in the vagina.  Cervix visualized.  Cleaned with Betadine x 2.  Grasped anteriorly with a single tooth tenaculum.  Unable to pass IUD past internal os of cervix.  Plastic dilators used and there was significant resistance but able to progress the dilator into the uterus.  However, the sounding length was about 11 cm, longer than expected and patient was in significant pain. Given concern about false passage, possible uterine perforation, procedure was aborted.  Discussed with patient that the procedure can be re-attempted under ultrasound or she can explore another contraceptive option.  She did not wish for imaging or another Mirena insertion attempt at this time, wanted to proceed with Nexplanon placement.    Nexplanon Insertion Procedure Patient identified, informed  consent performed.  Patient does understand that irregular bleeding is a very common side effect of this medication. She was advised to have backup contraception for one week after placement. Pregnancy test in clinic today was negative.  Appropriate time out taken.  Patient's left arm was prepped and draped in the usual sterile fashion.. The ruler used to measure and mark insertion area.  Patient was prepped with alcohol swab and then injected with 3 ml of 1% lidocaine.  She was prepped with betadine, Nexplanon removed from packaging,  Device confirmed in needle, then inserted full length of needle and withdrawn per handbook instructions. Nexplanon was able to palpated in the patient's arm; patient palpated the insert herself. There was minimal blood loss.  Patient insertion site covered with guaze and a pressure bandage to reduce any bruising.  The patient tolerated the procedure well and was given post procedure instructions.   Routine preventative health maintenance measures emphasized.   Kathleen CollinsUGONNA  Kathleen Heitzenrater, MD, FACOG Attending Obstetrician & Gynecologist Center for [facility], [facility]

## 2014-03-08 ENCOUNTER — Encounter: Payer: Self-pay | Admitting: *Deleted

## 2014-03-09 ENCOUNTER — Encounter: Payer: Self-pay | Admitting: *Deleted

## 2014-04-29 ENCOUNTER — Encounter (HOSPITAL_COMMUNITY): Payer: Self-pay | Admitting: *Deleted

## 2014-04-29 ENCOUNTER — Emergency Department (HOSPITAL_COMMUNITY)
Admission: EM | Admit: 2014-04-29 | Discharge: 2014-04-30 | Disposition: A | Discharge status: Home / Self Care | Payer: Medicaid Other | Attending: Emergency Medicine | Admitting: Emergency Medicine

## 2014-04-29 DIAGNOSIS — R05 Cough: Secondary | ICD-10-CM | POA: Diagnosis present

## 2014-04-29 DIAGNOSIS — R51 Headache: Secondary | ICD-10-CM | POA: Diagnosis not present

## 2014-04-29 DIAGNOSIS — Z79899 Other long term (current) drug therapy: Secondary | ICD-10-CM | POA: Diagnosis not present

## 2014-04-29 DIAGNOSIS — Z880 Allergy status to penicillin: Secondary | ICD-10-CM | POA: Insufficient documentation

## 2014-04-29 DIAGNOSIS — Z8719 Personal history of other diseases of the digestive system: Secondary | ICD-10-CM | POA: Insufficient documentation

## 2014-04-29 DIAGNOSIS — J069 Acute upper respiratory infection, unspecified: Secondary | ICD-10-CM | POA: Insufficient documentation

## 2014-04-29 MED ORDER — LORATADINE-PSEUDOEPHEDRINE ER 5-120 MG PO TB12: 1.0000 | ORAL_TABLET | Freq: Two times a day (BID) | ORAL | Status: AC

## 2014-04-29 MED ORDER — DEXTROMETHORPHAN POLISTIREX 30 MG/5ML PO LQCR: 30.0000 mg | ORAL | Status: DC | PRN

## 2014-04-29 NOTE — Discharge Instructions (Signed)
Take Claritin as needed for congestion. Take delsym as needed for cough. Refer to attached documents for more information. Return to the ED with worsening or concerning symptoms.

## 2014-04-29 NOTE — ED Notes (Signed)
C/o a cough cold head congestion  sorethrioat since this am

## 2014-04-29 NOTE — ED Provider Notes (Signed)
CSN: [id number]     Arrival date & time 04/29/14  2321 History  This chart was scribed for non-physician practitioner, Emilia BeckKaitlyn Lexie Koehl, PA-C working with Layla MawKristen N Ward, DO by Greggory StallionKayla Andersen, ED scribe. This patient was seen in room TR08C/TR08C and the patient's care was started at 11:51 PM.   Chief Complaint  Patient presents with  . Cough   The history is provided by the patient. No language interpreter was used.    HPI Comments: Kathleen Harrell is a 34 y.o. female who presents to the Emergency Department complaining of nonproductive cough, sore throat and nasal congestion that started this morning. She reports associated headache. Pt has taken ibuprofen with little relief. Denies fever, chest pain.   Past Medical History  Diagnosis Date  . GERD (gastroesophageal reflux disease)    Past Surgical History  Procedure Laterality Date  . Cesarean section    . Cesarean section N/A 01/25/2013    Procedure: Repeat  CESAREAN SECTION;  Surgeon: Tereso NewcomerUgonna A Anyanwu, MD;  Location: [facility];  Service: Obstetrics;  Laterality: N/A;   Family History  Problem Relation Age of Onset  . Diabetes Mother   . Cancer Father     skin  . Hypertension Sister   . Diabetes Brother    History  Substance Use Topics  . Smoking status: Never Smoker   . Smokeless tobacco: Never Used  . Alcohol Use: No   OB History    Gravida Para Term Preterm AB TAB SAB Ectopic Multiple Living   4 3 3  1  1  1 4      Review of Systems  Constitutional: Negative for fever.  HENT: Positive for congestion and sore throat.   Respiratory: Positive for cough.   Cardiovascular: Negative for chest pain.  Neurological: Positive for headaches.  All other systems reviewed and are negative.  Allergies  Penicillins  Home Medications   Prior to Admission medications   Medication Sig Start Date End Date Taking? Authorizing Provider  HYDROcodone-acetaminophen (NORCO/VICODIN) 5-325 MG per tablet Take 1-2 tablets by mouth every 4  (four) hours as needed for moderate pain or severe pain. 10/27/13   Trixie DredgeEmily West, PA-C  ibuprofen (ADVIL,MOTRIN) 600 MG tablet Take 1 tablet (600 mg total) by mouth every 6 (six) hours. 01/27/13   Jacklyn ShellFrances Cresenzo-Dishmon, CNM  ibuprofen (ADVIL,MOTRIN) 800 MG tablet Take 1 tablet (800 mg total) by mouth every 8 (eight) hours as needed for mild pain or moderate pain. 02/07/14   Tereso NewcomerUgonna A Anyanwu, MD  oxyCODONE-acetaminophen (PERCOCET/ROXICET) 5-325 MG per tablet Take 1 tablet by mouth every 4 (four) hours as needed. 01/27/13   Deirdre Colin Mulders Poe, CNM  Prenatal Multivit-Min-Fe-FA (PRENATAL VITAMINS) 0.8 MG tablet Take 1 tablet by mouth daily. 09/28/12   Heather Alger Memosonovan Hogan, CNM   BP 118/62 mmHg  Pulse 81  Temp(Src) 99.1 F (37.3 C) (Oral)  Resp 16  SpO2 99%   Physical Exam  Constitutional: She is oriented to person, place, and time. She appears well-developed and well-nourished. No distress.  HENT:  Head: Normocephalic and atraumatic.  Nose: Right sinus exhibits no maxillary sinus tenderness and no frontal sinus tenderness. Left sinus exhibits no maxillary sinus tenderness and no frontal sinus tenderness.  Mouth/Throat: Oropharynx is clear and moist.  Eyes: Conjunctivae and EOM are normal.  Neck: Neck supple. No tracheal deviation present.  Cardiovascular: Normal rate, regular rhythm and normal heart sounds.   Pulmonary/Chest: Effort normal and breath sounds normal. No respiratory distress. She has no wheezes. She  has no rhonchi. She has no rales.  Musculoskeletal: Normal range of motion.  Lymphadenopathy:    She has no cervical adenopathy.  Neurological: She is alert and oriented to person, place, and time.  Skin: Skin is warm and dry.  Psychiatric: She has a normal mood and affect. Her behavior is normal.  Nursing note and vitals reviewed.   ED Course  Procedures (including critical care time)  DIAGNOSTIC STUDIES: Oxygen Saturation is 99% on RA, normal by my interpretation.    COORDINATION  OF CARE: 11:52 PM-Advised pt chest xray is not necessary at this time based on her exam and she agrees. Discussed treatment plan which includes a decongestant and continuing ibuprofen with pt at bedside and pt agreed to plan.   Labs Review Labs Reviewed - No data to display  Imaging Review No results found.   EKG Interpretation None      MDM   Final diagnoses:  URI (upper respiratory infection)    Vitals stable and patient afebrile. Lungs CTA. Patient has no signs of bacterial infection on my exam. Patient likely has viral illness. Patient will be discharged with symptomatic treatment. Patient instructed to return with worsening or concerning symptoms.   I personally performed the services described in this documentation, which was scribed in my presence. The recorded information has been reviewed and is accurate.  Emilia Beck, PA-C 04/30/14 0017  Layla Maw Ward, DO 04/30/14 1610

## 2014-09-26 ENCOUNTER — Telehealth: Payer: Self-pay | Admitting: *Deleted

## 2014-09-26 NOTE — Telephone Encounter (Signed)
Spoke with patient via phone.  Explained this is not uncommon for patients to have irregular bleeding while on Nexplanon.  Patient states understanding.

## 2014-09-26 NOTE — Telephone Encounter (Signed)
Attempted to contact patient via phone at approximately 1450.  No answer.  Left message to return our call.

## 2014-09-26 NOTE — Telephone Encounter (Signed)
Patient left message on nurse voicemail on 09/25/14 at 1443.  Patient states she had a nexplanon placed on 02/07/14.  States at that time she wasn't having monthly cycles.  She is now having cycles and wants to know if this is normal.  Requests a return call to [phone].

## 2017-02-01 ENCOUNTER — Ambulatory Visit (INDEPENDENT_AMBULATORY_CARE_PROVIDER_SITE_OTHER): Payer: Medicaid Other | Admitting: Family Medicine

## 2017-02-01 ENCOUNTER — Encounter: Payer: Self-pay | Admitting: Family Medicine

## 2017-02-01 VITALS — BP 129/77 | HR 82 | Ht 64.0 in | Wt 180.5 lb

## 2017-02-01 DIAGNOSIS — Z3049 Encounter for surveillance of other contraceptives: Secondary | ICD-10-CM | POA: Diagnosis present

## 2017-02-01 DIAGNOSIS — Z3046 Encounter for surveillance of implantable subdermal contraceptive: Secondary | ICD-10-CM

## 2017-02-01 DIAGNOSIS — Z30017 Encounter for initial prescription of implantable subdermal contraceptive: Secondary | ICD-10-CM

## 2017-02-01 MED ORDER — ETONOGESTREL 68 MG ~~LOC~~ IMPL
68.0000 mg | DRUG_IMPLANT | Freq: Once | SUBCUTANEOUS | Status: AC
  Administered 2017-02-01: 68 mg via SUBCUTANEOUS

## 2017-02-01 NOTE — Addendum Note (Signed)
Addended by: Gerome ApleyZEYFANG, LINDA L on: 02/01/2017 03:39 PM   Modules accepted: Orders

## 2017-02-01 NOTE — Progress Notes (Signed)
Nexplanon Removal:  Patient given informed consent for removal of her Implanon, time out was performed.  Signed copy in the chart.  Appropriate time out taken. Implanon site identified.  Area prepped in usual sterile fashon. One cc of 1% lidocaine was used to anesthetize the area at the distal end of the implant. A small stab incision was made right beside the implant on the distal portion.  The implanon rod was grasped using hemostats and removed without difficulty.  There was less than 3 cc blood loss. There were no complications.  A small amount of antibiotic ointment and steri-strips were applied over the small incision.  A pressure bandage was applied to reduce any bruising.  The patient tolerated the procedure well and was given post procedure instructions.  Nexplanon Insertion:  Patient given informed consent, signed copy in the chart, time out was performed. Pregnancy test was neg. Appropriate time out taken.  Patient's left arm was prepped and draped in the usual sterile fashion.. The ruler used to measure and mark insertion area.  Pt was prepped with alcohol swab and then injected with 5 cc of 1% lidocaine.  Pt was prepped with betadine, Implanon removed form packaging,  Device confirmed in needle, then inserted full length of needle and withdrawn per handbook instructions.  Device palpated by physician and patient.  Pt insertion site covered with pressure dressing.   Minimal blood loss.  Pt tolerated the procedure well.

## 2020-02-07 ENCOUNTER — Telehealth: Payer: Self-pay | Admitting: Family Medicine

## 2020-03-11 ENCOUNTER — Ambulatory Visit (INDEPENDENT_AMBULATORY_CARE_PROVIDER_SITE_OTHER): Payer: Medicaid Other | Admitting: Obstetrics

## 2020-03-11 ENCOUNTER — Other Ambulatory Visit (HOSPITAL_COMMUNITY)
Admission: RE | Admit: 2020-03-11 | Discharge: 2020-03-11 | Disposition: A | Discharge status: Home / Self Care | Payer: Medicaid Other | Source: Ambulatory Visit | Attending: Obstetrics | Admitting: Obstetrics

## 2020-03-11 ENCOUNTER — Other Ambulatory Visit: Payer: Self-pay

## 2020-03-11 ENCOUNTER — Encounter: Payer: Self-pay | Admitting: Obstetrics

## 2020-03-11 VITALS — BP 126/77 | HR 91 | Ht 64.0 in | Wt 193.0 lb

## 2020-03-11 DIAGNOSIS — Z1239 Encounter for other screening for malignant neoplasm of breast: Secondary | ICD-10-CM | POA: Diagnosis not present

## 2020-03-11 DIAGNOSIS — Z01419 Encounter for gynecological examination (general) (routine) without abnormal findings: Secondary | ICD-10-CM | POA: Diagnosis not present

## 2020-03-11 DIAGNOSIS — Z3046 Encounter for surveillance of implantable subdermal contraceptive: Secondary | ICD-10-CM

## 2020-03-11 NOTE — Progress Notes (Signed)
Subjective:        Kathleen Harrell is a 39 y.o. female here for a routine exam.  Current complaints: None.    Personal health questionnaire:  Is patient Ashkenazi Jewish, have a family history of breast and/or ovarian cancer: no Is there a family history of uterine cancer diagnosed at age < 74, gastrointestinal cancer, urinary tract cancer, family member who is a Personnel officer syndrome-associated carrier: no Is the patient overweight and hypertensive, family history of diabetes, personal history of gestational diabetes, preeclampsia or PCOS: yes Is patient over 8, have PCOS,  family history of premature CHD under age 21, diabetes, smoke, have hypertension or peripheral artery disease:  no At any time, has a partner hit, kicked or otherwise hurt or frightened you?: no Over the past 2 weeks, have you felt down, depressed or hopeless?: no Over the past 2 weeks, have you felt little interest or pleasure in doing things?:no   Gynecologic History No LMP recorded. Patient has had an implant. Contraception: Nexplanon Last Pap: 2014. Results were: normal Last mammogram: none. Results were: none  Obstetric History OB History  Gravida Para Term Preterm AB Living  4 3 3   1 4   SAB IAB Ectopic Multiple Live Births  1     1 1     # Outcome Date GA Lbr Len/2nd Weight Sex Delivery Anes PTL Lv  4 Term 01/25/13 [redacted]w[redacted]d   F CS-LTranv Spinal  LIV  3 Term 05/2009 [redacted]w[redacted]d  5 lb 9 oz (2.523 kg) M CS-Unspec EPI       Birth Comments: no complications, born at [facility]  2 SAB 2007          1A Term 05/2000 [redacted]w[redacted]d  5 lb 5 oz (2.41 kg) M CS-Unspec EPI       Birth Comments: fraternal twins, one twin has PROM, has c/s due to breech, born at [place], [redacted]w[redacted]d  1B Term 05/2000 [redacted]w[redacted]d  4 lb 9 oz (2.07 kg) M        Past Medical History:  Diagnosis Date  . GERD (gastroesophageal reflux disease)     Past Surgical History:  Procedure Laterality Date  . CESAREAN SECTION    . CESAREAN SECTION N/A 01/25/2013   Procedure:  Repeat  CESAREAN SECTION;  Surgeon: [redacted]w[redacted]d, MD;  Location: [facility];  Service: Obstetrics;  Laterality: N/A;     Current Outpatient Medications:  .  ferrous sulfate 325 (65 FE) MG tablet, Take 325 mg daily with breakfast by mouth., Disp: , Rfl:  .  ibuprofen (ADVIL,MOTRIN) 800 MG tablet, Take 1 tablet (800 mg total) by mouth every 8 (eight) hours as needed for mild pain or moderate pain., Disp: 30 tablet, Rfl: 3 .  loratadine-pseudoephedrine (CLARITIN-D 12 HOUR) 5-120 MG per tablet, Take 1 tablet by mouth 2 (two) times daily., Disp: 20 tablet, Rfl: 0 .  ranitidine (ZANTAC) 150 MG tablet, Take 150 mg 2 (two) times daily by mouth., Disp: , Rfl:  .  Vitamin D, Ergocalciferol, (DRISDOL) 50000 units CAPS capsule, Take 50,000 Units every 7 (seven) days by mouth., Disp: , Rfl:  .  dextromethorphan (DELSYM) 30 MG/5ML liquid, Take 5 mLs (30 mg total) by mouth as needed for cough., Disp: 89 mL, Rfl: 0 .  HYDROcodone-acetaminophen (NORCO/VICODIN) 5-325 MG per tablet, Take 1-2 tablets by mouth every 4 (four) hours as needed for moderate pain or severe pain. (Patient not taking: Reported on 03/11/2020), Disp: 15 tablet, Rfl: 0 .  ibuprofen (ADVIL,MOTRIN) 600 MG  tablet, Take 1 tablet (600 mg total) by mouth every 6 (six) hours. (Patient not taking: Reported on 03/11/2020), Disp: 30 tablet, Rfl: 0 .  oxyCODONE-acetaminophen (PERCOCET/ROXICET) 5-325 MG per tablet, Take 1 tablet by mouth every 4 (four) hours as needed. (Patient not taking: Reported on 03/11/2020), Disp: 20 tablet, Rfl: 0 .  Prenatal Multivit-Min-Fe-FA (PRENATAL VITAMINS) 0.8 MG tablet, Take 1 tablet by mouth daily. (Patient not taking: Reported on 03/11/2020), Disp: 30 tablet, Rfl: 12 Allergies  Allergen Reactions  . Penicillins Other (See Comments)    Dizzy, shaky    Social History   Tobacco Use  . Smoking status: Never Smoker  . Smokeless tobacco: Never Used  Substance Use Topics  . Alcohol use: No    Family History  Problem  Relation Age of Onset  . Diabetes Mother   . Cancer Father        skin  . Hypertension Sister   . Diabetes Brother       Review of Systems  Constitutional: negative for fatigue and weight loss Respiratory: negative for cough and wheezing Cardiovascular: negative for chest pain, fatigue and palpitations Gastrointestinal: negative for abdominal pain and change in bowel habits Musculoskeletal:negative for myalgias Neurological: negative for gait problems and tremors Behavioral/Psych: negative for abusive relationship, depression Endocrine: negative for temperature intolerance    Genitourinary:negative for abnormal menstrual periods, genital lesions, hot flashes, sexual problems and vaginal discharge Integument/breast: negative for breast lump, breast tenderness, nipple discharge and skin lesion(s)    Objective:       BP 126/77   Pulse 91   Ht 5\' 4"  (1.626 m)   Wt 193 lb (87.5 kg)   BMI 33.13 kg/m  General:   alert and no distress  Skin:   no rash or abnormalities  Lungs:   clear to auscultation bilaterally  Heart:   regular rate and rhythm, S1, S2 normal, no murmur, click, rub or gallop  Breasts:   normal without suspicious masses, skin or nipple changes or axillary nodes  Abdomen:  normal findings: no organomegaly, soft, non-tender and no hernia  Pelvis:  External genitalia: normal general appearance Urinary system: urethral meatus normal and bladder without fullness, nontender Vaginal: normal without tenderness, induration or masses Cervix: normal appearance Adnexa: normal bimanual exam Uterus: anteverted and non-tender, normal size   Lab Review Urine pregnancy test Labs reviewed yes Radiologic studies reviewed no  50% of 20 min visit spent on counseling and coordination of care.   Assessment:     1. Encounter for gynecological examination with Papanicolaou smear of cervix Rx: - Cytology - PAP( [facility])  2. Screening breast examination Rx: - MM Digital  Screening; Future  3. Encounter for surveillance of implantable subdermal contraceptive - pleased with Nexplanon    Plan:    Education reviewed: calcium supplements, depression evaluation, low fat, low cholesterol diet, safe sex/STD prevention, self breast exams and weight bearing exercise. Contraception: Nexplanon. Mammogram ordered. Follow up in: 1 year.    Orders Placed This Encounter  Procedures  . MM Digital Screening    Standing Status:   Future    Standing Expiration Date:   03/11/2021    Order Specific Question:   Reason for Exam (SYMPTOM  OR DIAGNOSIS REQUIRED)    Answer:   Screening    Order Specific Question:   Is the patient pregnant?    Answer:   No    Order Specific Question:   Preferred imaging location?    Answer:   [facility]  .  RPR+HBsAg+HCVAb+...    Brock Bad, MD 03/11/2020 11:18 AM

## 2020-03-11 NOTE — Progress Notes (Signed)
GYN presents for AEX/PAP/STD screening.  Last PAP 08/03/2012  PHQ-9=0

## 2020-03-12 ENCOUNTER — Encounter: Payer: Self-pay | Admitting: Obstetrics

## 2020-03-12 ENCOUNTER — Ambulatory Visit (INDEPENDENT_AMBULATORY_CARE_PROVIDER_SITE_OTHER): Payer: Medicaid Other | Admitting: Obstetrics

## 2020-03-12 DIAGNOSIS — Z3046 Encounter for surveillance of implantable subdermal contraceptive: Secondary | ICD-10-CM | POA: Diagnosis not present

## 2020-03-12 DIAGNOSIS — B9689 Other specified bacterial agents as the cause of diseases classified elsewhere: Secondary | ICD-10-CM

## 2020-03-12 MED ORDER — DOXYCYCLINE HYCLATE 100 MG PO CAPS: 100.0000 mg | ORAL_CAPSULE | Freq: Two times a day (BID) | ORAL | 0 refills | Status: DC

## 2020-03-12 MED ORDER — ETONOGESTREL 68 MG ~~LOC~~ IMPL
68.0000 mg | DRUG_IMPLANT | Freq: Once | SUBCUTANEOUS | Status: AC
  Administered 2020-03-12: 16:00:00 68 mg via SUBCUTANEOUS

## 2020-03-12 NOTE — Progress Notes (Addendum)
GYN presents for Nexplanon removal and  Insertion.  Administrations This Visit    etonogestrel (NEXPLANON) implant 68 mg    Admin Date 03/12/2020 Action Given Dose 68 mg Route Subdermal Administered By Maretta Bees, RMA

## 2020-03-12 NOTE — Progress Notes (Signed)
Nexplanon Procedure Note    PROCEDURE: Nexplanon removal and placement Performing Provider: Brock Bad MD  Patient education prior to procedure, explained risk, benefits of Nexplanon, reviewed alternative options. Patient reported understanding. Gave consent to continue with procedure.  Patient had Nexplanon inserted in 2015. Desires removal today w/ reinsertion  PROCEDURE:  Pregnancy Text :  not indicated Site (check):      left arm         Sterile Preparation:   Betadinex3 Lot # [id number] Expiration Date: 2024 JAN 14    The patient's left arm was palpated and the implant device located. The area was prepped with Betadinex3. The distal end of the device was palpated and 1.5 cc of 1% lidocaine without epinephrine was injected. A 1.5 mm incision was made. Any fibrotic tissue was carefully dissected away using blunt and/or sharp dissection. The device was removed in an intact manner.   Insertion site was the same as the removal site. Nexplanon  was inserted subcutaneously.Needle was removed from the insertion site. Nexplanon capsule was palpated by provider and patient to assure satisfactory placement. Dressing applied.   Followup: The patient tolerated the procedure well without complications.  Standard post-procedure care is explained and return precautions are given.  Brock Bad, MD 03/12/2020 2:38 PM

## 2020-03-12 NOTE — Addendum Note (Signed)
Addended by: Maretta Bees on: 03/12/2020 03:43 PM   Modules accepted: Orders

## 2020-03-12 NOTE — Addendum Note (Signed)
Addended by: Maretta Bees on: 03/12/2020 11:54 AM   Modules accepted: Orders

## 2020-03-13 LAB — CERVICOVAGINAL ANCILLARY ONLY
Bacterial Vaginitis (gardnerella): NEGATIVE
Candida Glabrata: NEGATIVE
Candida Vaginitis: NEGATIVE
Chlamydia: NEGATIVE
Comment: NEGATIVE
Comment: NEGATIVE
Comment: NEGATIVE
Comment: NEGATIVE
Comment: NEGATIVE
Comment: NORMAL
Neisseria Gonorrhea: NEGATIVE
Trichomonas: NEGATIVE

## 2020-03-13 LAB — CYTOLOGY - PAP
Comment: NEGATIVE
Diagnosis: UNDETERMINED — AB
High risk HPV: NEGATIVE

## 2020-03-26 ENCOUNTER — Other Ambulatory Visit: Payer: Self-pay

## 2020-03-26 ENCOUNTER — Ambulatory Visit (INDEPENDENT_AMBULATORY_CARE_PROVIDER_SITE_OTHER): Payer: Medicaid Other | Admitting: Obstetrics

## 2020-03-26 ENCOUNTER — Encounter: Payer: Self-pay | Admitting: Obstetrics

## 2020-03-26 VITALS — BP 138/80 | HR 87 | Wt 190.0 lb

## 2020-03-26 DIAGNOSIS — Z3046 Encounter for surveillance of implantable subdermal contraceptive: Secondary | ICD-10-CM

## 2020-03-26 DIAGNOSIS — Z1239 Encounter for other screening for malignant neoplasm of breast: Secondary | ICD-10-CM | POA: Diagnosis not present

## 2020-03-26 NOTE — Telephone Encounter (Signed)
error 

## 2020-03-26 NOTE — Progress Notes (Signed)
Subjective:    Kathleen Harrell is a 40 y.o. female who presents for contraception counseling. The patient has no complaints today. The patient is sexually active. Pertinent past medical history: none.  The information documented in the HPI was reviewed and verified.  Menstrual History: OB History    Gravida  4   Para  3   Term  3   Preterm      AB  1   Living  4     SAB  1   IAB      Ectopic      Multiple  1   Live Births  1            Patient's last menstrual period was 03/11/2020.   There are no problems to display for this patient.  Past Medical History:  Diagnosis Date  . GERD (gastroesophageal reflux disease)     Past Surgical History:  Procedure Laterality Date  . CESAREAN SECTION    . CESAREAN SECTION N/A 01/25/2013   Procedure: Repeat  CESAREAN SECTION;  Surgeon: Tereso Newcomer, MD;  Location: [facility];  Service: Obstetrics;  Laterality: N/A;     Current Outpatient Medications:  .  loratadine-pseudoephedrine (CLARITIN-D 12 HOUR) 5-120 MG per tablet, Take 1 tablet by mouth 2 (two) times daily., Disp: 20 tablet, Rfl: 0 .  ranitidine (ZANTAC) 150 MG tablet, Take 150 mg 2 (two) times daily by mouth., Disp: , Rfl:  .  Vitamin D, Ergocalciferol, (DRISDOL) 50000 units CAPS capsule, Take 50,000 Units every 7 (seven) days by mouth., Disp: , Rfl:  .  dextromethorphan (DELSYM) 30 MG/5ML liquid, Take 5 mLs (30 mg total) by mouth as needed for cough., Disp: 89 mL, Rfl: 0 .  doxycycline (VIBRAMYCIN) 100 MG capsule, Take 1 capsule (100 mg total) by mouth 2 (two) times daily., Disp: 14 capsule, Rfl: 0 .  ferrous sulfate 325 (65 FE) MG tablet, Take 325 mg daily with breakfast by mouth., Disp: , Rfl:  .  HYDROcodone-acetaminophen (NORCO/VICODIN) 5-325 MG per tablet, Take 1-2 tablets by mouth every 4 (four) hours as needed for moderate pain or severe pain., Disp: 15 tablet, Rfl: 0 .  oxyCODONE-acetaminophen (PERCOCET/ROXICET) 5-325 MG per tablet, Take 1 tablet by mouth  every 4 (four) hours as needed. (Patient not taking: No sig reported), Disp: 20 tablet, Rfl: 0 .  Prenatal Multivit-Min-Fe-FA (PRENATAL VITAMINS) 0.8 MG tablet, Take 1 tablet by mouth daily. (Patient not taking: No sig reported), Disp: 30 tablet, Rfl: 12 Allergies  Allergen Reactions  . Penicillins Other (See Comments)    Dizzy, shaky    Social History   Tobacco Use  . Smoking status: Never Smoker  . Smokeless tobacco: Never Used  Substance Use Topics  . Alcohol use: No    Family History  Problem Relation Age of Onset  . Diabetes Mother   . Cancer Father        skin  . Hypertension Sister   . Diabetes Brother        Review of Systems Constitutional: negative for weight loss Genitourinary:negative for abnormal menstrual periods and vaginal discharge   Objective:   BP 138/80   Pulse 87   Wt 190 lb (86.2 kg)   LMP 03/11/2020   BMI 32.61 kg/m    General:   alert and no distress  Skin:   no rash or abnormalities  Lungs:   clear to auscultation bilaterally  Heart:   regular rate and rhythm, S1, S2 normal, no murmur,  click, rub or gallop               Left Upper Extremity:  Nexplanon insertion site is clean, dry and non tender.  Rod palpated, intact and non tender.      Lab Review Urine pregnancy test Labs reviewed no Radiologic studies reviewed no  50% of 15 min visit spent on counseling and coordination of care.    Assessment:    40 y.o., continuing Nexplanon, no contraindications.   Plan:    All questions answered. Contraception: Nexplanon. Follow up in 1 year.  Orders Placed This Encounter  Procedures  . MM Digital Screening    Standing Status:   Future    Standing Expiration Date:   03/26/2021    Order Specific Question:   Reason for Exam (SYMPTOM  OR DIAGNOSIS REQUIRED)    Answer:   Screening    Order Specific Question:   Is the patient pregnant?    Answer:   No    Order Specific Question:   Preferred imaging location?    Answer:   [facility]    Brock Bad, MD 03/26/2020 12:27 PM

## 2020-03-26 NOTE — Progress Notes (Signed)
Pt presents for f/u after Nexplanon removal/reinsertion on 03/12/20. No complaints per pt

## 2023-04-07 ENCOUNTER — Ambulatory Visit: Payer: Medicaid Other | Admitting: Obstetrics & Gynecology

## 2023-04-08 ENCOUNTER — Other Ambulatory Visit (HOSPITAL_COMMUNITY)
Admission: RE | Admit: 2023-04-08 | Discharge: 2023-04-08 | Disposition: A | Discharge status: Home / Self Care | Payer: Medicaid Other | Source: Ambulatory Visit | Attending: Obstetrics and Gynecology | Admitting: Obstetrics and Gynecology

## 2023-04-08 ENCOUNTER — Ambulatory Visit: Payer: Medicaid Other | Admitting: Obstetrics and Gynecology

## 2023-04-08 ENCOUNTER — Encounter: Payer: Self-pay | Admitting: Obstetrics and Gynecology

## 2023-04-08 VITALS — BP 123/78 | HR 96 | Wt 186.0 lb

## 2023-04-08 DIAGNOSIS — Z01419 Encounter for gynecological examination (general) (routine) without abnormal findings: Secondary | ICD-10-CM | POA: Diagnosis present

## 2023-04-08 DIAGNOSIS — D259 Leiomyoma of uterus, unspecified: Secondary | ICD-10-CM | POA: Diagnosis not present

## 2023-04-08 DIAGNOSIS — Z3046 Encounter for surveillance of implantable subdermal contraceptive: Secondary | ICD-10-CM | POA: Insufficient documentation

## 2023-04-08 MED ORDER — ETONOGESTREL 68 MG ~~LOC~~ IMPL
68.0000 mg | DRUG_IMPLANT | Freq: Once | SUBCUTANEOUS | Status: AC
  Administered 2023-04-08: 68 mg via SUBCUTANEOUS

## 2023-04-08 NOTE — Progress Notes (Signed)
Pt is in office for Nexplanon removal and replace.

## 2023-04-08 NOTE — Addendum Note (Signed)
Addended by: Maretta Bees on: 04/08/2023 10:47 AM   Modules accepted: Orders

## 2023-04-08 NOTE — Progress Notes (Signed)
Administrations This Visit     etonogestrel (NEXPLANON) implant 68 mg     Admin Date 04/08/2023 Action Given Dose 68 mg Route Subdermal Documented By Maretta Bees, RMA

## 2023-04-08 NOTE — Patient Instructions (Signed)
Hysterectomy Information °A hysterectomy is a surgery in which the uterus is removed. The lowest part of the uterus (cervix), which opens into the vagina, may be removed as well. In some cases, the fallopian tubes, the ovaries,  or both the fallopian tubes and the ovaries may also be removed. °This procedure may be done to treat different medical problems. It may also be done to help transgender men feel more masculine. After the procedure, a woman will no longer have menstrual periods and will not be able to become pregnant (sterile). °What are the reasons for a hysterectomy? °There are many reasons why a person might have this procedure. They include: °Persistent, abnormal vaginal bleeding. °Long-term (chronic) pelvic pain or infection. °Endometriosis. This is when the lining of the uterus (endometrium) starts to grow outside the uterus. °Adenomyosis. This is when the endometrium starts to grow in the muscle of the uterus. °Pelvic organ prolapse. This is a condition in which the uterus falls down into the vagina. °Noncancerous growths in the uterus (uterine fibroids) that cause symptoms. °The presence of precancerous cells. °Cervical or uterine cancer. °Sex change. This helps a transgender man complete his female identity. °What are the different types of hysterectomy? °There are three different types of hysterectomy: °Supracervical hysterectomy. In this type, the top part of the uterus is removed, but not the cervix. °Total hysterectomy. In this type, the uterus and cervix are removed. °Radical hysterectomy. In this type, the uterus, the cervix, and the tissue that holds the uterus in place (parametrium) are removed. °What are the different ways a hysterectomy can be performed? °There are many different ways a hysterectomy can be performed, including: °Abdominal hysterectomy. In this type, an incision is made in the abdomen. The uterus is removed through this incision. °Vaginal hysterectomy. In this type, an  incision is made in the vagina. The uterus is removed through this incision. There are no abdominal incisions. °Conventional laparoscopic hysterectomy. In this type, 3 or 4 small incisions are made in the abdomen. A thin, lighted tube with a camera (laparoscope) is inserted into one of the incisions. Other tools are put through the other incisions. The uterus is cut into small pieces. The small pieces are removed through the incisions or the vagina. °Laparoscopically assisted vaginal hysterectomy (LAVH). In this type, 3 or 4 small incisions are made in the abdomen. Part of the surgery is performed laparoscopically and the other part is done vaginally. The uterus is removed through the vagina. °Robot-assisted laparoscopic hysterectomy. In this type, a laparoscope and other tools are inserted into 3 or 4 small incisions in the abdomen. A computer-controlled device is used to give the surgeon a 3D image and to help control the surgical instruments. This allows for more precise movements of surgical instruments. The uterus is cut into small pieces and removed through the incisions or the vagina. °Discuss the options with your health care provider to determine which type is the right one for you. °What are the risks of this surgery? °Generally, this is a safe procedure. However, problems may occur, including: °Bleeding and risk of blood transfusion. Tell your health care provider if you do not want to receive any blood products. °Blood clots in the legs or lung. °Infection. °Damage to nearby structures or organs. °Allergic reactions to medicines. °Having to change to an abdominal hysterectomy after starting a less invasive technique. °What to expect after a hysterectomy °You will be given pain medicine. °You may need to stay in the hospital for 1-2 days   to recover, depending on the type of hysterectomy you had. °You will need to have someone with you for the first 3-5 days after you go home. °You will need to follow up  with your surgeon in 2-4 weeks after surgery to evaluate your progress. °If the ovaries are removed, you will have early menopause symptoms such as hot flashes, night sweats, and insomnia. °If you had a hysterectomy for a problem that was not cancer or a condition that could not lead to cancer, then you no longer need Pap tests. However, even if you no longer need a Pap test, get regular pelvic exams to make sure no other problems are developing. °Questions to ask your health care provider °Is a hysterectomy medically necessary? Do I have other treatment options for my condition? °What are my options for hysterectomy procedure? °What organs and tissues need to be removed? °What are the risks? °What are the benefits? °How long will I need to stay in the hospital after the procedure? °How long will I need to recover at home? °What symptoms can I expect after the procedure? °Summary °A hysterectomy is a surgery in which the uterus is removed. The fallopian tubes, the ovaries, or both may be removed as well. °This procedure may be done to treat different medical problems. It may also be done to complete your female identity during a sex change. °After the procedure, a woman will no longer have a menstrual period and will not be able to become pregnant. °There are three types of hysterectomy. Discuss with your health care provider which options are right for you. °This is a safe procedure, though there are some risks. Risks include infection, bleeding, blood clots, or damage to nearby organs. °This information is not intended to replace advice given to you by your health care provider. Make sure you discuss any questions you have with your health care provider. °Document Revised: 01/06/2019 Document Reviewed: 01/06/2019 °Elsevier Patient Education © 2021 [employer] ° °

## 2023-04-08 NOTE — Progress Notes (Signed)
Subjective:     Kathleen Harrell is a 43 y.o. female P4 with LMP 03/28/23 who is here for a comprehensive physical exam. The patient reports a monthly period with Nexplanon lasting 7 days. She reports some pelvic discomfort associated with fibroid uterus. She is sexually active without complaints. She denies urinary incontinence. Patient is using Nexplanon for contraception which is due for removal and replacement.   Past Medical History:  Diagnosis Date   GERD (gastroesophageal reflux disease)    Past Surgical History:  Procedure Laterality Date   CESAREAN SECTION     CESAREAN SECTION N/A 01/25/2013   Procedure: Repeat  CESAREAN SECTION;  Surgeon: Tereso Newcomer, MD;  Location: [facility];  Service: Obstetrics;  Laterality: N/A;   Family History  Problem Relation Age of Onset   Diabetes Mother    Cancer Father        skin   Hypertension Sister    Diabetes Brother     Social History   Socioeconomic History   Marital status: Single    Spouse name: Not on file   Number of children: Not on file   Years of education: Not on file   Highest education level: Not on file  Occupational History   Not on file  Tobacco Use   Smoking status: Never   Smokeless tobacco: Never  Substance and Sexual Activity   Alcohol use: No   Drug use: No   Sexual activity: Yes    Birth control/protection: Injection  Other Topics Concern   Not on file  Social History Narrative   Not on file   Social Drivers of Health   Financial Resource Strain: Not on file  Food Insecurity: Not on file  Transportation Needs: Not on file  Physical Activity: Not on file  Stress: Not on file  Social Connections: Not on file  Intimate Partner Violence: Not on file   Health Maintenance  Topic Date Due   Hepatitis C Screening  Never done   INFLUENZA VACCINE  10/22/2022   COVID-19 Vaccine (1 - 2024-25 season) Never done   DTaP/Tdap/Td (2 - Td or Tdap) 12/08/2022   Cervical Cancer Screening (HPV/Pap Cotest)   03/11/2025   HIV Screening  Completed   HPV VACCINES  Aged Out       Review of Systems Pertinent items noted in HPI and remainder of comprehensive ROS otherwise negative.   Objective:  Blood pressure 123/78, pulse 96, weight 186 lb (84.4 kg), last menstrual period 03/28/2023.   GENERAL: Well-developed, well-nourished female in no acute distress.  HEENT: Normocephalic, atraumatic. Sclerae anicteric.  NECK: Supple. Normal thyroid.  LUNGS: Clear to auscultation bilaterally.  HEART: Regular rate and rhythm. BREASTS: Symmetric in size. No palpable masses or lymphadenopathy, skin changes, or nipple drainage. ABDOMEN: Soft, nontender, palpable fibroid uterus. No organomegaly. PELVIC: Normal external female genitalia. Vagina is pink and rugated.  Normal discharge. Normal appearing cervix. Uterus is 16-weeks in size. No adnexal mass or tenderness. Chaperone present during the pelvic exam EXTREMITIES: No cyanosis, clubbing, or edema, 2+ distal pulses.     Assessment:    Healthy female exam.      Plan:    Pap smear collected STI screening per patient request Health maintenance labs ordered Pelvic ultrasound ordered Discussed definitive management of fibroid uterus with hysterectomy which patient is interested in. Patient still desires Nexplanon. Patient with previous cesarean section x 3.   NEXPLANON PROCEDURE Patient given informed consent, signed copy in the chart, time out was performed.  Pregnancy test was negative. Patient given informed consent for removal of her Nexplanon, time out was performed.  Signed copy in the chart.  Appropriate time out taken. Implanon site identified.  Area prepped in usual sterile fashon. One cc of 1% lidocaine was used to anesthetize the area at the distal end of the implant. A small stab incision was made right beside the implant on the distal portion.  The Nexplanon rod was grasped using hemostats and removed without difficulty.  There was less than 3 cc  blood loss. There were no complications.  Nexplanon removed form packaging.  Device confirmed in needle, then inserted full length of needle using the previously made incision and withdrawn per handbook instructions.  Patient insertion site covered with steri strips, a bandaid and Coband.   Minimal blood loss.  Patient tolerated the procedure well.   Patient will be contacted with results and for further discussion on surgical intervention  See After Visit Summary for Counseling Recommendations

## 2023-04-09 LAB — COMPREHENSIVE METABOLIC PANEL
ALT: 11 [IU]/L (ref 0–32)
AST: 14 [IU]/L (ref 0–40)
Albumin: 4.1 g/dL (ref 3.9–4.9)
Alkaline Phosphatase: 57 [IU]/L (ref 44–121)
BUN/Creatinine Ratio: 9 (ref 9–23)
BUN: 8 mg/dL (ref 6–24)
Bilirubin Total: <0.2 mg/dL (ref 0.0–1.2)
CO2: 22 mmol/L (ref 20–29)
Calcium: 9.1 mg/dL (ref 8.7–10.2)
Chloride: 102 mmol/L (ref 96–106)
Creatinine, Ser: 0.94 mg/dL (ref 0.57–1.00)
Globulin, Total: 2.6 g/dL (ref 1.5–4.5)
Glucose: 91 mg/dL (ref 70–99)
Potassium: 4.1 mmol/L (ref 3.5–5.2)
Sodium: 138 mmol/L (ref 134–144)
Total Protein: 6.7 g/dL (ref 6.0–8.5)
eGFR: 77 mL/min/{1.73_m2} (ref >59)

## 2023-04-09 LAB — CBC
Hematocrit: 27.8 % — ABNORMAL LOW (ref 34.0–46.6)
Hemoglobin: 7.9 g/dL — ABNORMAL LOW (ref 11.1–15.9)
MCH: 20.2 pg — ABNORMAL LOW (ref 26.6–33.0)
MCHC: 28.4 g/dL — ABNORMAL LOW (ref 31.5–35.7)
MCV: 71 fL — ABNORMAL LOW (ref 79–97)
Platelets: 394 10*3/uL (ref 150–450)
RBC: 3.92 x10E6/uL (ref 3.77–5.28)
RDW: 19 % — ABNORMAL HIGH (ref 11.7–15.4)
WBC: 8.3 10*3/uL (ref 3.4–10.8)

## 2023-04-09 LAB — LIPID PANEL
Chol/HDL Ratio: 3 {ratio} (ref 0.0–4.4)
Cholesterol, Total: 166 mg/dL (ref 100–199)
HDL: 56 mg/dL (ref >39)
LDL Chol Calc (NIH): 98 mg/dL (ref 0–99)
Triglycerides: 63 mg/dL (ref 0–149)
VLDL Cholesterol Cal: 12 mg/dL (ref 5–40)

## 2023-04-09 LAB — CERVICOVAGINAL ANCILLARY ONLY
Chlamydia: NEGATIVE
Comment: NEGATIVE
Comment: NORMAL
Neisseria Gonorrhea: NEGATIVE

## 2023-04-09 LAB — TSH: TSH: 1.27 u[IU]/mL (ref 0.450–4.500)

## 2023-04-09 LAB — HEMOGLOBIN A1C
Est. average glucose Bld gHb Est-mCnc: 120 mg/dL
Hgb A1c MFr Bld: 5.8 % — ABNORMAL HIGH (ref 4.8–5.6)

## 2023-04-09 LAB — HEPATITIS C ANTIBODY: Hep C Virus Ab: NONREACTIVE

## 2023-04-09 LAB — RPR: RPR Ser Ql: NONREACTIVE

## 2023-04-09 LAB — HEPATITIS B SURFACE ANTIGEN: Hepatitis B Surface Ag: NEGATIVE

## 2023-04-09 LAB — HIV ANTIBODY (ROUTINE TESTING W REFLEX): HIV Screen 4th Generation wRfx: NONREACTIVE

## 2023-04-12 ENCOUNTER — Ambulatory Visit (HOSPITAL_BASED_OUTPATIENT_CLINIC_OR_DEPARTMENT_OTHER)
Admission: RE | Admit: 2023-04-12 | Discharge: 2023-04-12 | Disposition: A | Discharge status: Home / Self Care | Payer: Medicaid Other | Source: Ambulatory Visit | Attending: Obstetrics and Gynecology | Admitting: Obstetrics and Gynecology

## 2023-04-12 DIAGNOSIS — D259 Leiomyoma of uterus, unspecified: Secondary | ICD-10-CM

## 2023-04-15 LAB — CYTOLOGY - PAP
Comment: NEGATIVE
Diagnosis: NEGATIVE
High risk HPV: NEGATIVE

## 2023-05-27 ENCOUNTER — Encounter: Payer: Self-pay | Admitting: Obstetrics and Gynecology

## 2023-05-27 ENCOUNTER — Other Ambulatory Visit (HOSPITAL_COMMUNITY)
Admission: RE | Admit: 2023-05-27 | Discharge: 2023-05-27 | Disposition: A | Discharge status: Home / Self Care | Source: Ambulatory Visit | Attending: Obstetrics and Gynecology | Admitting: Obstetrics and Gynecology

## 2023-05-27 ENCOUNTER — Ambulatory Visit: Payer: Medicaid Other | Admitting: Obstetrics and Gynecology

## 2023-05-27 VITALS — BP 128/81 | HR 93 | Wt 185.0 lb

## 2023-05-27 DIAGNOSIS — D509 Iron deficiency anemia, unspecified: Secondary | ICD-10-CM | POA: Diagnosis not present

## 2023-05-27 DIAGNOSIS — D259 Leiomyoma of uterus, unspecified: Secondary | ICD-10-CM | POA: Diagnosis present

## 2023-05-27 DIAGNOSIS — N939 Abnormal uterine and vaginal bleeding, unspecified: Secondary | ICD-10-CM | POA: Diagnosis not present

## 2023-05-27 DIAGNOSIS — N941 Unspecified dyspareunia: Secondary | ICD-10-CM | POA: Diagnosis not present

## 2023-05-27 DIAGNOSIS — Z1231 Encounter for screening mammogram for malignant neoplasm of breast: Secondary | ICD-10-CM

## 2023-05-27 DIAGNOSIS — Z3202 Encounter for pregnancy test, result negative: Secondary | ICD-10-CM

## 2023-05-27 LAB — POCT URINE PREGNANCY: Preg Test, Ur: NEGATIVE

## 2023-05-27 NOTE — Progress Notes (Signed)
 GYNECOLOGY VISIT  Patient name: Kathleen Harrell MRN [id number]  Date of birth: 1980-09-07 Chief Complaint:   Consult  History:  Kathleen Harrell is a 43 y.o. [phone] being seen today for AUB and discussion of surgical management.     Discussed the use of AI scribe software for clinical note transcription with the patient, who gave verbal consent to proceed.  History of Present Illness   Kathleen Harrell is a 43 year old female who presents for management options for irregular menstrual bleeding. She was previously seen by Dr. Jolayne Panther for bleeding issues.  She is experiencing irregular menstrual bleeding following the recent replacement of her Nexplanon. Her cycles have become more irregular and heavier than normal, with inconsistent bleeding patterns that include regular flow, heavier bleeding, and lighter days, persisting for approximately two weeks at a time. She attributes these changes to the new Nexplanon.  She has a history of fibroids and is exploring treatment options for managing the associated bleeding. She is not planning to have more children and has had three prior C-sections with no complications. She currently uses a Nexplanon implant for contraception and is considering a hysterectomy as a definitive solution to remove the fibroids and stop her periods.  She is currently taking iron supplements due to a previously noted low blood count.       Past Medical History:  Diagnosis Date   GERD (gastroesophageal reflux disease)     Past Surgical History:  Procedure Laterality Date   CESAREAN SECTION     CESAREAN SECTION N/A 01/25/2013   Procedure: Repeat  CESAREAN SECTION;  Surgeon: Tereso Newcomer, MD;  Location: [facility];  Service: Obstetrics;  Laterality: N/A;    The following portions of the patient's history were reviewed and updated as appropriate: allergies, current medications, past family history, past medical history, past social history, past surgical history  and problem list.   Health Maintenance:   Last pap     Component Value Date/Time   DIAGPAP  04/08/2023 1042    - Negative for intraepithelial lesion or malignancy (NILM)   DIAGPAP (A) 03/11/2020 1113    - Atypical squamous cells of undetermined significance (ASC-US)   HPVHIGH Negative 04/08/2023 1042   HPVHIGH Negative 03/11/2020 1113   ADEQPAP  04/08/2023 1042    Satisfactory for evaluation; transformation zone component PRESENT.   ADEQPAP  03/11/2020 1113    Satisfactory for evaluation; transformation zone component PRESENT.    High Risk HPV: Positive  Adequacy:  Satisfactory for evaluation, transformation zone component PRESENT  Diagnosis:  Atypical squamous cells of undetermined significance (ASC-US)  Last mammogram: none on fiel   Review of Systems:  Pertinent items are noted in HPI. Comprehensive review of systems was otherwise negative.   Objective:  Physical Exam BP 128/81   Pulse 93   Wt 185 lb (83.9 kg)   BMI 31.76 kg/m    Physical Exam Vitals and nursing note reviewed. Exam conducted with a chaperone present.  Constitutional:      Appearance: Normal appearance.  HENT:     Head: Normocephalic and atraumatic.  Pulmonary:     Effort: Pulmonary effort is normal.     Breath sounds: Normal breath sounds.  Genitourinary:    General: Normal vulva.     Exam position: Lithotomy position.     Vagina: Normal.     Cervix: Normal.  Skin:    General: Skin is warm and dry.  Neurological:     General: No  focal deficit present.     Mental Status: She is alert.  Psychiatric:        Mood and Affect: Mood normal.        Behavior: Behavior normal.        Thought Content: Thought content normal.        Judgment: Judgment normal.      Labs and Imaging No results found.  Endometrial Biopsy Procedure  Patient identified, informed consent performed,  indication reviewed, consent signed.  Reviewed risk of perforation, pain, bleeding, insufficient sample, etc were  reviewd. Time out was performed.  Urine pregnancy test negative.  Speculum placed in the vagina.  Cervix visualized.  Cleaned with Betadine x 2.  Anterior cervix grasped anteriorly with a single tooth tenaculum.  Paracervical block was not administered.  Endometrial pipelle was used to draw up 1cc of 1% lidocaine, introduced into the cervical os but not likely into the endometrium. The pipelle was removed and the cervix was dilated. The pipelle was introduced into the pipelle.   The pipelle was passed twice without difficulty and sample obtained. Tenaculum was removed, good hemostasis noted.  Patient tolerated procedure well.  Patient was given post-procedure instructions.      Assessment & Plan:   Assessment and Plan    Uterine Fibroids Irregular and heavy menstrual bleeding, likely exacerbated by recent Nexplanon placement. Discussed various treatment options including medication, uterine artery embolization, Sonata procedure, myomectomy, endometrial ablation, and hysterectomy. Patient expressed preference for hysterectomy. -Now s/p uncomplicated endometrial biopsy today to rule out any abnormality before proceeding with hysterectomy. -Schedule hysterectomy, anticipating a delay due to OR availability. -Plan for IV iron to improve preoperative hemoglobin levels.  Nexplanon Contraception Recent placement, contributing to irregular menstrual bleeding. -Continue Nexplanon as contraceptive method.  Anemia Low blood count noted on recent labs, likely secondary to heavy menstrual bleeding. -Continue oral iron supplementation. -Consider IV iron to improve preoperative hemoglobin levels.      Patient desires surgical management with RA-TLH, BS, cysto.  The risks of surgery were discussed in detail with the patient including but not limited to: bleeding which may require transfusion or reoperation; infection which may require prolonged hospitalization or re-hospitalization and antibiotic therapy;  injury to bowel, bladder, ureters and major vessels or other surrounding organs which may lead to other procedures; formation of adhesions; need for additional procedures including laparotomy or subsequent procedures secondary to intraoperative injury or abnormal pathology; thromboembolic phenomenon; incisional problems and other postoperative or anesthesia complications.  Patient was told that the likelihood that her condition and symptoms will be treated effectively with this surgical management was high; the postoperative expectations were also discussed in detail. The patient also understands the alternative treatment options which were discussed in full. All questions were answered.  She was told that she will be contacted by our surgical scheduler regarding the time and date of her surgery; routine preoperative instructions will be given to her by the preoperative nursing team.    Printed patient education handouts about the procedure were given to the patient to review at home.  Routine preventative health maintenance measures emphasized.  Lorriane Shire, MD Minimally Invasive Gynecologic Surgery [facility] for [facility], [facility]

## 2023-05-27 NOTE — Progress Notes (Signed)
 Pt is in office for hysterectomy consult.  Pt would like to discuss options and what would be best for her case.  Pt has nexplanon in place, irregular since replaced.  Last pap 03/2023- normal. Pt does need mammogram.

## 2023-05-28 ENCOUNTER — Encounter: Payer: Self-pay | Admitting: Obstetrics and Gynecology

## 2023-05-28 ENCOUNTER — Other Ambulatory Visit: Payer: Self-pay | Admitting: Obstetrics and Gynecology

## 2023-05-28 DIAGNOSIS — D259 Leiomyoma of uterus, unspecified: Secondary | ICD-10-CM

## 2023-05-28 DIAGNOSIS — N939 Abnormal uterine and vaginal bleeding, unspecified: Secondary | ICD-10-CM

## 2023-05-28 LAB — SURGICAL PATHOLOGY

## 2023-05-31 ENCOUNTER — Telehealth: Payer: Self-pay

## 2023-05-31 NOTE — Telephone Encounter (Signed)
 Hello,  Patient will be scheduled as soon possible.   Auth Submission: NO AUTH NEEDED Site of care: Site of care: [facility] Payer: medicaid [employer] Medication & CPT/J Code(s) submitted: Venofer (Iron Sucrose) J1756 Route of submission (phone, fax, portal): phone Phone # Fax # Auth type: Buy/Bill HB Units/visits requested: 200mg , 5 doses Reference number: [id number] Approval from: 05/31/23 to 11/21/23

## 2023-06-09 ENCOUNTER — Ambulatory Visit

## 2023-06-10 ENCOUNTER — Inpatient Hospital Stay: Admission: RE | Admit: 2023-06-10 | Source: Ambulatory Visit

## 2023-06-15 ENCOUNTER — Ambulatory Visit
Admission: RE | Admit: 2023-06-15 | Discharge: 2023-06-15 | Disposition: A | Discharge status: Home / Self Care | Source: Ambulatory Visit | Attending: Obstetrics and Gynecology

## 2023-06-15 DIAGNOSIS — Z1231 Encounter for screening mammogram for malignant neoplasm of breast: Secondary | ICD-10-CM

## 2023-06-17 ENCOUNTER — Encounter: Payer: Self-pay | Admitting: Obstetrics and Gynecology

## 2023-06-21 ENCOUNTER — Encounter: Attending: Obstetrics and Gynecology | Admitting: Emergency Medicine

## 2023-06-21 VITALS — BP 130/78 | HR 80 | Temp 98.7°F | Resp 18

## 2023-06-21 DIAGNOSIS — D509 Iron deficiency anemia, unspecified: Secondary | ICD-10-CM | POA: Diagnosis present

## 2023-06-21 DIAGNOSIS — D500 Iron deficiency anemia secondary to blood loss (chronic): Secondary | ICD-10-CM | POA: Diagnosis present

## 2023-06-21 DIAGNOSIS — N939 Abnormal uterine and vaginal bleeding, unspecified: Secondary | ICD-10-CM | POA: Insufficient documentation

## 2023-06-21 MED ORDER — IRON SUCROSE 20 MG/ML IV SOLN
200.0000 mg | Freq: Once | INTRAVENOUS | Status: AC
Start: 2023-06-21 — End: 2023-06-21
  Administered 2023-06-21: 200 mg via INTRAVENOUS

## 2023-06-21 MED ORDER — ACETAMINOPHEN 325 MG PO TABS
650.0000 mg | ORAL_TABLET | Freq: Once | ORAL | Status: AC
  Administered 2023-06-21: 650 mg via ORAL

## 2023-06-21 MED ORDER — DIPHENHYDRAMINE HCL 25 MG PO CAPS
25.0000 mg | ORAL_CAPSULE | Freq: Once | ORAL | Status: AC
Start: 2023-06-21 — End: 2023-06-21
  Administered 2023-06-21: 25 mg via ORAL

## 2023-06-21 NOTE — Progress Notes (Signed)
 Diagnosis: Iron Deficiency Anemia  Provider:   Lorriane Shire  Procedure: IV Push  IV Type: Peripheral, IV Location: R Antecubital  Venofer (Iron Sucrose), Dose: 200 mg  Post Infusion IV Care: Observation period completed and Peripheral IV Discontinued  Discharge: Condition: Good, Destination: Home . AVS Provided  Performed by:  Arrie Senate, RN

## 2023-06-23 ENCOUNTER — Encounter: Attending: Obstetrics and Gynecology | Admitting: Internal Medicine

## 2023-06-23 VITALS — BP 123/81 | HR 62 | Temp 98.3°F | Resp 16

## 2023-06-23 DIAGNOSIS — D509 Iron deficiency anemia, unspecified: Secondary | ICD-10-CM | POA: Insufficient documentation

## 2023-06-23 DIAGNOSIS — N939 Abnormal uterine and vaginal bleeding, unspecified: Secondary | ICD-10-CM | POA: Diagnosis present

## 2023-06-23 MED ORDER — ACETAMINOPHEN 325 MG PO TABS
650.0000 mg | ORAL_TABLET | Freq: Once | ORAL | Status: AC
  Administered 2023-06-23: 650 mg via ORAL

## 2023-06-23 MED ORDER — IRON SUCROSE 20 MG/ML IV SOLN
200.0000 mg | Freq: Once | INTRAVENOUS | Status: AC
  Administered 2023-06-23: 200 mg via INTRAVENOUS

## 2023-06-23 MED ORDER — DIPHENHYDRAMINE HCL 25 MG PO CAPS
25.0000 mg | ORAL_CAPSULE | Freq: Once | ORAL | Status: AC
Start: 2023-06-23 — End: 2023-06-23
  Administered 2023-06-23: 25 mg via ORAL

## 2023-06-23 NOTE — Progress Notes (Signed)
 Diagnosis: Iron Deficiency Anemia  Provider:   Lorriane Shire, MD  Procedure: IV Push  IV Type: Peripheral, IV Location: R Antecubital  Venofer (Iron Sucrose), Dose: 200 mg  Post Infusion IV Care: Observation period completed  Discharge: Condition: Good, Destination: Home . AVS Provided  Performed by:  Cleotilde Neer, LPN

## 2023-06-25 ENCOUNTER — Encounter: Admitting: Emergency Medicine

## 2023-06-25 VITALS — BP 117/86 | HR 86 | Temp 98.2°F | Resp 16

## 2023-06-25 DIAGNOSIS — N939 Abnormal uterine and vaginal bleeding, unspecified: Secondary | ICD-10-CM | POA: Diagnosis not present

## 2023-06-25 DIAGNOSIS — D509 Iron deficiency anemia, unspecified: Secondary | ICD-10-CM

## 2023-06-25 MED ORDER — ACETAMINOPHEN 325 MG PO TABS
650.0000 mg | ORAL_TABLET | Freq: Once | ORAL | Status: AC
  Administered 2023-06-25: 650 mg via ORAL

## 2023-06-25 MED ORDER — IRON SUCROSE 20 MG/ML IV SOLN
200.0000 mg | Freq: Once | INTRAVENOUS | Status: AC
  Administered 2023-06-25: 200 mg via INTRAVENOUS

## 2023-06-25 MED ORDER — SODIUM CHLORIDE 0.9 % IV SOLN: Freq: Once | INTRAVENOUS | Status: AC

## 2023-06-25 MED ORDER — DIPHENHYDRAMINE HCL 25 MG PO CAPS
25.0000 mg | ORAL_CAPSULE | Freq: Once | ORAL | Status: AC
  Administered 2023-06-25: 25 mg via ORAL

## 2023-06-25 NOTE — Progress Notes (Signed)
 Diagnosis: Iron Deficiency Anemia  Provider:  Lorriane Shire, MD   Procedure: IV Push  IV Type: Peripheral, IV Location: R Antecubital  Venofer (Iron Sucrose), Dose: 200 mg  Post Infusion IV Care: Observation period completed and Peripheral IV Discontinued  Discharge: Condition: Good, Destination: Home . AVS Provided  Performed by:  Arrie Senate, RN

## 2023-06-28 ENCOUNTER — Encounter: Admitting: Emergency Medicine

## 2023-06-28 VITALS — BP 120/79 | HR 88 | Temp 98.6°F | Resp 18

## 2023-06-28 DIAGNOSIS — N939 Abnormal uterine and vaginal bleeding, unspecified: Secondary | ICD-10-CM | POA: Diagnosis not present

## 2023-06-28 DIAGNOSIS — D509 Iron deficiency anemia, unspecified: Secondary | ICD-10-CM

## 2023-06-28 MED ORDER — ACETAMINOPHEN 325 MG PO TABS
650.0000 mg | ORAL_TABLET | Freq: Once | ORAL | Status: AC
  Administered 2023-06-28: 650 mg via ORAL

## 2023-06-28 MED ORDER — DIPHENHYDRAMINE HCL 25 MG PO CAPS
25.0000 mg | ORAL_CAPSULE | Freq: Once | ORAL | Status: AC
  Administered 2023-06-28: 25 mg via ORAL

## 2023-06-28 MED ORDER — IRON SUCROSE 20 MG/ML IV SOLN
200.0000 mg | Freq: Once | INTRAVENOUS | Status: AC
  Administered 2023-06-28: 200 mg via INTRAVENOUS

## 2023-06-28 MED ORDER — SODIUM CHLORIDE 0.9 % IV SOLN: Freq: Once | INTRAVENOUS | Status: AC

## 2023-06-28 NOTE — Progress Notes (Signed)
 Diagnosis: Iron Deficiency Anemia  Provider:  Lorriane Shire, MD   Procedure: IV Push  IV Type: Peripheral, IV Location: R Antecubital  Venofer (Iron Sucrose), Dose: 200 mg  Post Infusion IV Care: Observation period completed and Peripheral IV Discontinued  Discharge: Condition: Good, Destination: Home . AVS Provided  Performed by:  Arrie Senate, RN

## 2023-06-30 ENCOUNTER — Encounter: Admitting: Emergency Medicine

## 2023-06-30 VITALS — BP 137/75 | HR 78 | Temp 98.1°F | Resp 18

## 2023-06-30 DIAGNOSIS — N939 Abnormal uterine and vaginal bleeding, unspecified: Secondary | ICD-10-CM | POA: Diagnosis not present

## 2023-06-30 DIAGNOSIS — D509 Iron deficiency anemia, unspecified: Secondary | ICD-10-CM

## 2023-06-30 MED ORDER — ACETAMINOPHEN 325 MG PO TABS
650.0000 mg | ORAL_TABLET | Freq: Once | ORAL | Status: AC
  Administered 2023-06-30: 650 mg via ORAL

## 2023-06-30 MED ORDER — SODIUM CHLORIDE 0.9 % IV SOLN: Freq: Once | INTRAVENOUS | Status: AC

## 2023-06-30 MED ORDER — DIPHENHYDRAMINE HCL 25 MG PO CAPS
25.0000 mg | ORAL_CAPSULE | Freq: Once | ORAL | Status: AC
  Administered 2023-06-30: 25 mg via ORAL

## 2023-06-30 MED ORDER — IRON SUCROSE 20 MG/ML IV SOLN
200.0000 mg | Freq: Once | INTRAVENOUS | Status: AC
  Administered 2023-06-30: 200 mg via INTRAVENOUS

## 2023-06-30 NOTE — Progress Notes (Signed)
 Diagnosis: Iron Deficiency Anemia  Provider:  Lorriane Shire, MD   Procedure: IV Push  IV Type: Peripheral, IV Location: R Antecubital  Venofer (Iron Sucrose), Dose: 200 mg  Post Infusion IV Care: Observation period completed and Peripheral IV Discontinued  Discharge: Condition: Good, Destination: Home . AVS Provided  Performed by:  Arrie Senate, RN

## 2023-07-23 ENCOUNTER — Ambulatory Visit: Admitting: Physical Therapy

## 2023-08-09 ENCOUNTER — Telehealth: Payer: Self-pay

## 2023-08-09 NOTE — Telephone Encounter (Signed)
 I called to schedule patient for surgery w/ Dr. Elester Grim. I left a voicemail advising that Dr. Elester Grim has a recent opening on 08/12/23 and asked patient to call me back to schedule at [phone].
